# Patient Record
Sex: Female | Born: 1970 | Race: White | Hispanic: No | Marital: Married | Smoking: Never smoker
Health system: Southern US, Community
[De-identification: ages and names within clinical notes are randomized; demographics above are authoritative.]

## PROBLEM LIST (undated history)

## (undated) DIAGNOSIS — K219 Gastro-esophageal reflux disease without esophagitis: Secondary | ICD-10-CM

## (undated) DIAGNOSIS — Z8619 Personal history of other infectious and parasitic diseases: Secondary | ICD-10-CM

## (undated) DIAGNOSIS — T7840XA Allergy, unspecified, initial encounter: Secondary | ICD-10-CM

## (undated) DIAGNOSIS — D239 Other benign neoplasm of skin, unspecified: Secondary | ICD-10-CM

## (undated) DIAGNOSIS — F32A Depression, unspecified: Secondary | ICD-10-CM

## (undated) DIAGNOSIS — D126 Benign neoplasm of colon, unspecified: Secondary | ICD-10-CM

## (undated) DIAGNOSIS — Z87898 Personal history of other specified conditions: Secondary | ICD-10-CM

## (undated) DIAGNOSIS — Z872 Personal history of diseases of the skin and subcutaneous tissue: Secondary | ICD-10-CM

## (undated) DIAGNOSIS — F329 Major depressive disorder, single episode, unspecified: Secondary | ICD-10-CM

## (undated) HISTORY — PX: UPPER GASTROINTESTINAL ENDOSCOPY: SHX188

## (undated) HISTORY — DX: Benign neoplasm of colon, unspecified: D12.6

## (undated) HISTORY — DX: Allergy, unspecified, initial encounter: T78.40XA

## (undated) HISTORY — DX: Personal history of other specified conditions: Z87.898

## (undated) HISTORY — PX: COLONOSCOPY: SHX174

## (undated) HISTORY — DX: Depression, unspecified: F32.A

## (undated) HISTORY — PX: POLYPECTOMY: SHX149

## (undated) HISTORY — DX: Personal history of diseases of the skin and subcutaneous tissue: Z87.2

## (undated) HISTORY — DX: Major depressive disorder, single episode, unspecified: F32.9

## (undated) HISTORY — DX: Other benign neoplasm of skin, unspecified: D23.9

## (undated) HISTORY — DX: Gastro-esophageal reflux disease without esophagitis: K21.9

## (undated) HISTORY — DX: Personal history of other infectious and parasitic diseases: Z86.19

---

## 2008-08-18 LAB — HM MAMMOGRAPHY: HM Mammogram: NORMAL

## 2008-12-18 DIAGNOSIS — D126 Benign neoplasm of colon, unspecified: Secondary | ICD-10-CM

## 2008-12-18 HISTORY — DX: Benign neoplasm of colon, unspecified: D12.6

## 2009-09-18 LAB — HM PAP SMEAR: HM Pap smear: NORMAL

## 2010-05-15 ENCOUNTER — Encounter: Payer: Self-pay | Admitting: Family

## 2010-05-15 ENCOUNTER — Ambulatory Visit (INDEPENDENT_AMBULATORY_CARE_PROVIDER_SITE_OTHER): Payer: BC Managed Care – PPO | Admitting: Family

## 2010-05-15 DIAGNOSIS — R609 Edema, unspecified: Secondary | ICD-10-CM

## 2010-05-15 DIAGNOSIS — R400 Somnolence: Secondary | ICD-10-CM

## 2010-05-15 DIAGNOSIS — M7989 Other specified soft tissue disorders: Secondary | ICD-10-CM

## 2010-05-15 DIAGNOSIS — R413 Other amnesia: Secondary | ICD-10-CM

## 2010-05-15 DIAGNOSIS — R404 Transient alteration of awareness: Secondary | ICD-10-CM

## 2010-05-15 DIAGNOSIS — R229 Localized swelling, mass and lump, unspecified: Secondary | ICD-10-CM

## 2010-05-15 DIAGNOSIS — F32A Depression, unspecified: Secondary | ICD-10-CM

## 2010-05-15 DIAGNOSIS — F3289 Other specified depressive episodes: Secondary | ICD-10-CM

## 2010-05-15 DIAGNOSIS — R5381 Other malaise: Secondary | ICD-10-CM

## 2010-05-15 DIAGNOSIS — F329 Major depressive disorder, single episode, unspecified: Secondary | ICD-10-CM

## 2010-05-15 DIAGNOSIS — R5383 Other fatigue: Secondary | ICD-10-CM

## 2010-05-15 DIAGNOSIS — E559 Vitamin D deficiency, unspecified: Secondary | ICD-10-CM

## 2010-05-15 LAB — CBC WITH DIFFERENTIAL/PLATELET
Basophils Absolute: 0 10*3/uL (ref 0.0–0.1)
Eosinophils Relative: 0 % (ref 0–5)
Lymphocytes Relative: 14 % (ref 12–46)
Lymphs Abs: 1.3 10*3/uL (ref 0.7–4.0)
MCV: 92.9 fL (ref 78.0–100.0)
Neutro Abs: 7.4 10*3/uL (ref 1.7–7.7)
Neutrophils Relative %: 81 % — ABNORMAL HIGH (ref 43–77)
Platelets: 218 10*3/uL (ref 150–400)
RBC: 4.34 MIL/uL (ref 3.87–5.11)
RDW: 12.7 % (ref 11.5–15.5)
WBC: 9.1 10*3/uL (ref 4.0–10.5)

## 2010-05-15 LAB — TSH: TSH: 0.717 u[IU]/mL (ref 0.350–4.500)

## 2010-05-15 LAB — HEPATIC FUNCTION PANEL
ALT: 12 U/L (ref 0–35)
Alkaline Phosphatase: 59 U/L (ref 39–117)
Bilirubin, Direct: 0.2 mg/dL (ref 0.0–0.3)
Indirect Bilirubin: 0.5 mg/dL (ref 0.0–0.9)

## 2010-05-15 LAB — BASIC METABOLIC PANEL
CO2: 25 mEq/L (ref 19–32)
Chloride: 100 mEq/L (ref 96–112)
Creat: 0.73 mg/dL (ref 0.40–1.20)
Sodium: 138 mEq/L (ref 135–145)

## 2010-05-15 MED ORDER — FUROSEMIDE 20 MG PO TABS: 20.0000 mg | ORAL_TABLET | Freq: Every day | ORAL | Status: DC | PRN

## 2010-05-15 NOTE — Progress Notes (Signed)
Subjective:    Patient ID: Marie Ortega, female    DOB: 20-Apr-1970, 40 y.o.   MRN: [id number]  HPI  She moved here 1 month ago from [place].  Works as a Charity fundraiser for [employer].  Leg swelling x 1 year-  Both legs- uncomfortable. No associated pain in legs except for the tightness associated with edema. Denies associated SOB.  No swelling in the AM- worse as the day wears on.    Memory- some problem with word finding- "hand me the mattress, (pillow)". Forgetfulness,  Loses things.   Somnolence- feels herself falling asleep even at work.  Sleeps 7 hours a night- though restless for last 2-3 hours.  Does not snore.    Vitamin d deficiency- she reports that she she takes calcium + D supplement.  Depression- was treated 15 yrs ago.  Brother has bipolar.  Parents have no history of depression.    Review of Systems  Constitutional: Negative for fever and unexpected weight change.  HENT:       Some problems with hearing- use of claritin improved her symptoms  Eyes: Negative for visual disturbance.  Respiratory: Negative for shortness of breath and wheezing.   Cardiovascular: Positive for leg swelling. Negative for chest pain.  Gastrointestinal: Negative for nausea, vomiting and diarrhea.  Genitourinary: Negative for urgency and frequency.  Musculoskeletal:       See HPI  Neurological: Negative for dizziness, weakness and numbness.  Hematological: Negative for adenopathy.  Psychiatric/Behavioral:       See HPI   Chronic knee pain.    Past Medical History  Diagnosis Date  . Depression   . History of chicken pox   . Adenomatous colon polyp     gets colonoscopies every 3 years.      History   Social History  . Marital Status: Married    Spouse Name: N/A    Number of Children: 2  . Years of Education: N/A   Occupational History  . chemist    Social History Main Topics  . Smoking status: Never Smoker   . Smokeless tobacco: Never Used  . Alcohol Use: No  . Drug  Use: No  . Sexually Active: Not on file   Other Topics Concern  . Not on file   Social History Narrative   Caffeine use: noneRegular exercise:  No    No past surgical history on file.  Family History  Problem Relation Age of Onset  . Cancer Mother     colon    No Known Allergies  No current outpatient prescriptions on file prior to visit.    BP 100/50  Pulse 66  Temp(Src) 98.2 F (36.8 C) (Oral)  Resp 16  Ht 5\' 6"  (1.676 m)  Wt 132 lb 0.6 oz (59.893 kg)  BMI 21.31 kg/m2  LMP 04/30/2010    Objective:   Physical Exam  Constitutional: She is oriented to person, place, and time. She appears well-developed and well-nourished.       Thin white female, awake, alert and in NAD  HENT:  Head: Normocephalic and atraumatic.  Eyes: Conjunctivae are normal.  Neck: Neck supple.  Cardiovascular: Normal rate and regular rhythm.   Pulmonary/Chest: Effort normal and breath sounds normal.  Musculoskeletal:       1+ bilateral LE edema- non-pitting.  Neurological: She is alert and oriented to person, place, and time. Coordination normal.  Psychiatric: She has a normal mood and affect. Her behavior is normal. Judgment and thought content normal.  Assessment & Plan:

## 2010-05-15 NOTE — Patient Instructions (Signed)
Please complete your lab work on the first floor.  Follow up in 3 weeks.

## 2010-05-16 LAB — VITAMIN D 25 HYDROXY (VIT D DEFICIENCY, FRACTURES): Vit D, 25-Hydroxy: 43 ng/mL (ref 30–89)

## 2010-05-17 ENCOUNTER — Encounter: Payer: Self-pay | Admitting: Family

## 2010-05-18 ENCOUNTER — Telehealth: Payer: Self-pay | Admitting: *Deleted

## 2010-05-18 NOTE — Telephone Encounter (Signed)
Left message on machine for pt to return my call. Need to verify that pt wants [place] to use that to obtain records from previous doctor.

## 2010-05-18 NOTE — Telephone Encounter (Signed)
Spoke to pt, she would like [place] to use the records release that she brought to [place]. Release faxed.

## 2010-05-19 DIAGNOSIS — E559 Vitamin D deficiency, unspecified: Secondary | ICD-10-CM | POA: Insufficient documentation

## 2010-05-19 DIAGNOSIS — F32A Depression, unspecified: Secondary | ICD-10-CM | POA: Insufficient documentation

## 2010-05-19 DIAGNOSIS — M7989 Other specified soft tissue disorders: Secondary | ICD-10-CM | POA: Insufficient documentation

## 2010-05-19 DIAGNOSIS — R413 Other amnesia: Secondary | ICD-10-CM | POA: Insufficient documentation

## 2010-05-19 DIAGNOSIS — F329 Major depressive disorder, single episode, unspecified: Secondary | ICD-10-CM | POA: Insufficient documentation

## 2010-05-19 DIAGNOSIS — R400 Somnolence: Secondary | ICD-10-CM | POA: Insufficient documentation

## 2010-05-19 NOTE — Assessment & Plan Note (Signed)
Pt reports that this is stable. Largely situational depression approximatey 15 yrs ago.  Will monitor.

## 2010-05-19 NOTE — Assessment & Plan Note (Signed)
TSH is normal.  Per history she has a poor sleep pattern.  Consider addition of sleep aid next visit.

## 2010-05-19 NOTE — Assessment & Plan Note (Signed)
Lab work unremarkable, add prn lasix, if no improvement consider 2-D echo.

## 2010-05-19 NOTE — Assessment & Plan Note (Signed)
Lab work is unremarkable.  Other considerations include stress, fatigue or anxiety.  Will discuss further next visit.

## 2010-05-28 ENCOUNTER — Telehealth: Payer: Self-pay | Admitting: *Deleted

## 2010-05-28 ENCOUNTER — Encounter: Payer: Self-pay | Admitting: Family

## 2010-05-28 NOTE — Telephone Encounter (Signed)
Received records from [facility] 02/12/10 to 03/23/10.

## 2010-06-05 ENCOUNTER — Ambulatory Visit (INDEPENDENT_AMBULATORY_CARE_PROVIDER_SITE_OTHER): Payer: BC Managed Care – PPO | Admitting: Family

## 2010-06-05 ENCOUNTER — Encounter: Payer: Self-pay | Admitting: Family

## 2010-06-05 VITALS — BP 100/78 | HR 72 | Temp 97.8°F | Resp 16 | Ht 66.0 in | Wt 133.0 lb

## 2010-06-05 DIAGNOSIS — F32A Depression, unspecified: Secondary | ICD-10-CM

## 2010-06-05 DIAGNOSIS — R400 Somnolence: Secondary | ICD-10-CM

## 2010-06-05 DIAGNOSIS — M7989 Other specified soft tissue disorders: Secondary | ICD-10-CM

## 2010-06-05 DIAGNOSIS — F341 Dysthymic disorder: Secondary | ICD-10-CM

## 2010-06-05 DIAGNOSIS — R404 Transient alteration of awareness: Secondary | ICD-10-CM

## 2010-06-05 DIAGNOSIS — F419 Anxiety disorder, unspecified: Secondary | ICD-10-CM | POA: Insufficient documentation

## 2010-06-05 DIAGNOSIS — R609 Edema, unspecified: Secondary | ICD-10-CM

## 2010-06-05 LAB — BASIC METABOLIC PANEL
BUN: 14 mg/dL (ref 6–23)
Chloride: 98 mEq/L (ref 96–112)
Creat: 0.77 mg/dL (ref 0.40–1.20)
Glucose, Bld: 91 mg/dL (ref 70–99)
Potassium: 3.9 mEq/L (ref 3.5–5.3)

## 2010-06-05 MED ORDER — CITALOPRAM HYDROBROMIDE 20 MG PO TABS: 20.0000 mg | ORAL_TABLET | Freq: Every day | ORAL | Status: DC

## 2010-06-05 NOTE — Assessment & Plan Note (Signed)
I suspect that her complaints of memory and irritability are due to some associated depression and anxiety. She has tolerated SSRIs in the past remotely. We will give her a trial of citalopram. She was counseled on side effects including increased risk of suicide ideation. She is instructed to call if she develops worsening depression or suicidal ideation on this medicine. She verbalizes understanding. Plan followup in one month.

## 2010-06-05 NOTE — Progress Notes (Signed)
  Subjective:    Patient ID: Marie Ortega, female    DOB: Dec 03, 1970, 39 y.o.   MRN: [id number]  HPI Marie Ortega is a 40 year old female who presents today for followup of her edema and memory issues.  1. Edema-she notes some improvement with the use of daily Lasix. Although she does still have some swelling in the evenings. She denies difficulty laying flat, or shortness of breath with exertion.  2. Memory loss-  she notes that she continues to be forgetful. She reports that she is having a difficult time motivating to get out of bed in the mornings. She also finds it difficult to sit down and play with her children as she becomes easily frustrated. She notes that she falls asleep crying several times a week. She feels "snappy and edgy."  Last week she reports that she had difficulty focusing and felt "discombobulated".  somnolece- goes to bed at 10:30 falls asleep at 11PM.  Often feels herself, "dozing off."   Review of Systems See HPI  Past Medical History  Diagnosis Date  . Depression   . History of chicken pox   . Adenomatous colon polyp     gets colonoscopies every 3 years.      History   Social History  . Marital Status: Married    Spouse Name: N/A    Number of Children: 2  . Years of Education: N/A   Occupational History  . chemist    Social History Main Topics  . Smoking status: Never Smoker   . Smokeless tobacco: Never Used  . Alcohol Use: No  . Drug Use: No  . Sexually Active: Not on file   Other Topics Concern  . Not on file   Social History Narrative   Caffeine use: noneRegular exercise:  No    No past surgical history on file.  Family History  Problem Relation Age of Onset  . Cancer Mother     colon    No Known Allergies  Current Outpatient Prescriptions on File Prior to Visit  Medication Sig Dispense Refill  . Calcium Carbonate-Vitamin D (CALCIUM 600+D) 600-400 MG-UNIT per tablet Take 1 tablet by mouth 2 (two) times daily.       .  furosemide (LASIX) 20 MG tablet Take 1 tablet (20 mg total) by mouth daily as needed.  30 tablet  1  . Multiple Vitamin (MULTIVITAMIN) tablet Take 1 tablet by mouth daily.        Marland Kitchen loratadine (CLARITIN) 10 MG tablet Take 10 mg by mouth daily.          BP 100/78  Pulse 72  Temp(Src) 97.8 F (36.6 C) (Oral)  Resp 16  Ht 5\' 6"  (1.676 m)  Wt 133 lb (60.328 kg)  BMI 21.47 kg/m2  LMP 05/28/2010       Objective:   Physical Exam General: Pleasant white female, awake and alert and in no acute distress Cardiovascular: S1, S2 regular rate and rhythm Respiratory: Breath sounds clear to auscultation bilaterally without wheezes rales or rhonchi. Psychiatric: Moderately anxious appearing, alert and oriented x3. Speech and judgment are within normal limits. Extremities: No significant edema noted today on exam.       Assessment & Plan:  25 minutes spent with the patient today. Greater than 50% of this time was spent counseling the patient on her anxiety and depression.

## 2010-06-05 NOTE — Assessment & Plan Note (Signed)
Improved. Continue Lasix when necessary. Will check BE met today to assess potassium.

## 2010-06-05 NOTE — Assessment & Plan Note (Signed)
I suspect that this is related to her poor sleep pattern. Hopefully with improving her depression and anxiety her sleep pattern will also improve.

## 2010-06-05 NOTE — Patient Instructions (Addendum)
Citalopram- 20mg  tabs- take 1/2 tablet once daily for 1 week, then increase to a full tablet once daily on week two.  Follow up in 1 month.

## 2010-06-08 ENCOUNTER — Encounter: Payer: Self-pay | Admitting: Family

## 2010-06-09 ENCOUNTER — Telehealth: Payer: Self-pay | Admitting: *Deleted

## 2010-06-09 MED ORDER — FLUOXETINE HCL 10 MG PO CAPS: ORAL_CAPSULE | ORAL | Status: DC

## 2010-06-09 NOTE — Telephone Encounter (Signed)
Please let patient know that since she has tolerated fluoxetine (Prozac) in the past, I will switch her to prozac 10mg  daily for 1 week, then increase to 20mg  (2 tabs)on week two as tolerated.  Hopefully her symptoms will be less on fluoxetine, but either way should improve in a few weeks.

## 2010-06-09 NOTE — Telephone Encounter (Signed)
Received message from pt stating she is taking Citalopram 1/2 tablet daily since Friday and has had nausea and difficulty focusing on her current task. Pt wants to know how long these side effects should last. She is concerned about increasing to a whole tablet while experiencing these symptoms. Please advise.

## 2010-06-09 NOTE — Telephone Encounter (Signed)
Left message on machine to return my call. 

## 2010-06-10 NOTE — Telephone Encounter (Signed)
Pt left message stating it was ok to leave detailed message on her voicemail. Attempted to reach pt, left detailed message re: Melissa's instruction and to call if any questions.

## 2010-06-10 NOTE — Telephone Encounter (Signed)
Left message on machine to return my call. 

## 2010-06-12 ENCOUNTER — Telehealth: Payer: Self-pay | Admitting: *Deleted

## 2010-06-12 NOTE — Telephone Encounter (Signed)
Yes ok to wait a few days before starting prozac.

## 2010-06-12 NOTE — Telephone Encounter (Signed)
Pt.notified

## 2010-06-12 NOTE — Telephone Encounter (Signed)
Pt left message stating she is still having slight nausea from Citalopram and wanted to know if it would be ok to wait until her side effects have subsided before she starts the Prozac. Please advise.

## 2010-06-29 ENCOUNTER — Ambulatory Visit (INDEPENDENT_AMBULATORY_CARE_PROVIDER_SITE_OTHER): Payer: BC Managed Care – PPO | Admitting: Family

## 2010-06-29 ENCOUNTER — Encounter: Payer: Self-pay | Admitting: Family

## 2010-06-29 DIAGNOSIS — F341 Dysthymic disorder: Secondary | ICD-10-CM

## 2010-06-29 DIAGNOSIS — F32A Depression, unspecified: Secondary | ICD-10-CM

## 2010-06-29 DIAGNOSIS — F329 Major depressive disorder, single episode, unspecified: Secondary | ICD-10-CM

## 2010-06-29 NOTE — Assessment & Plan Note (Signed)
Denies suicide ideation.  Some improvement since last visit. I encouraged her to continue current dose of fluoxetine and to follow up in 1 month. If no improvement at that time with memory, will perform a MMSE- although I suspect that her memory issues are actually more related to her anxiety.

## 2010-06-29 NOTE — Progress Notes (Signed)
  Subjective:    Patient ID: Marie Ortega, female    DOB: [birth year], 40 y.o.   MRN: [id number]  HPI Marie Ortega is 40 yr old female who presents today for follow up of her depression/anxiety.  Notes that she felt "spacey and disconnected" on citalopram.  Notes that she was also grumpy on citalopram.  Today she feels that she is having to "work hard at things." She started the prozac on Memorial day.  Has only been taking the 20mg  for a little over 1 week. She is sleeping better.  Feels that she is better able to sit down with her kids and handle their bickering etch.  Overall feels some better.  Memory issues are unchanged.  Review of Systems See HPI  Past Medical History  Diagnosis Date  . Depression   . History of chicken pox   . Adenomatous colon polyp     gets colonoscopies every 3 years.      History   Social History  . Marital Status: Married    Spouse Name: N/A    Number of Children: 2  . Years of Education: N/A   Occupational History  . chemist    Social History Main Topics  . Smoking status: Never Smoker   . Smokeless tobacco: Never Used  . Alcohol Use: No  . Drug Use: No  . Sexually Active: Not on file   Other Topics Concern  . Not on file   Social History Narrative   Caffeine use: noneRegular exercise:  No    No past surgical history on file.  Family History  Problem Relation Age of Onset  . Cancer Mother     colon    No Known Allergies  Current Outpatient Prescriptions on File Prior to Visit  Medication Sig Dispense Refill  . BIOTIN FORTE PO Take 1 tablet by mouth daily.        . Calcium Carbonate-Vitamin D (CALCIUM 600+D) 600-400 MG-UNIT per tablet Take 1 tablet by mouth 2 (two) times daily.       Marland Kitchen FLUoxetine (PROZAC) 10 MG capsule One tablet by mouth once daily for 1 week, then increase to 2 tabs once daily on week two.  60 capsule  3  . furosemide (LASIX) 20 MG tablet Take 1 tablet (20 mg total) by mouth daily as needed.  30 tablet  1  .  loratadine (CLARITIN) 10 MG tablet Take 10 mg by mouth daily.        . Misc Natural Products (GLUCOSAMINE CHONDROITIN ADV PO) Take 2 tablets by mouth daily.        . Multiple Vitamin (MULTIVITAMIN) tablet Take 1 tablet by mouth daily.          BP 125/72  Pulse 66  Temp(Src) 97.6 F (36.4 C) (Oral)  Resp 16  Ht 5\' 6"  (1.676 m)  Wt 134 lb (60.782 kg)  BMI 21.63 kg/m2  LMP 05/28/2010       Objective:   Physical Exam  Constitutional: She appears well-developed and well-nourished.  Psychiatric: Her behavior is normal. Thought content normal.       Mildly anxious, seems stressed.           Assessment & Plan:  20 minutes spent with the patient today. Greater than 50% of this time was spent counseling patient on her anxiety and depression.

## 2010-06-29 NOTE — Patient Instructions (Signed)
Follow up in 1 month   

## 2010-07-08 ENCOUNTER — Encounter: Payer: Self-pay | Admitting: Family

## 2010-07-27 ENCOUNTER — Ambulatory Visit: Payer: BC Managed Care – PPO | Admitting: Family

## 2010-08-03 ENCOUNTER — Ambulatory Visit (INDEPENDENT_AMBULATORY_CARE_PROVIDER_SITE_OTHER): Payer: BC Managed Care – PPO | Admitting: Family

## 2010-08-03 ENCOUNTER — Encounter: Payer: Self-pay | Admitting: Family

## 2010-08-03 DIAGNOSIS — F341 Dysthymic disorder: Secondary | ICD-10-CM

## 2010-08-03 DIAGNOSIS — F329 Major depressive disorder, single episode, unspecified: Secondary | ICD-10-CM

## 2010-08-03 DIAGNOSIS — F32A Depression, unspecified: Secondary | ICD-10-CM

## 2010-08-03 NOTE — Patient Instructions (Signed)
Please follow up in 3 months. Sooner if problems or concerns.  

## 2010-08-03 NOTE — Progress Notes (Signed)
  Subjective:    Patient ID: Marie Ortega, female    DOB: 1970-08-11, 40 y.o.   MRN: [id number]  HPI  Marie Ortega is a 40 year old female who presents today for followup of her depression. She continues on fluoxetine. She reports that overall her mood is very much improved. She especially noted improvement in the normal moodiness that she experiences premenstrually. She reports that she is better able to handle her kids. She does note that this past week she has been waking up frequently in the middle of the night which is unusual for her. She later tells me that she is in the process of placing off around in the home. She denies that this is causing her any overt distress. She notes a side effect of yawning, however she notes that this seems to be improving. This past week she has felt more sleepy than usual, however she also notes that she's not been sleeping well. In the weeks prior she denied any unusual somnolence during the day. She reports that she has been taking a 10 mg tablet in the morning and 10 mg tablet in the evening. Review of Systems See history present illness  Past Medical History  Diagnosis Date  . Depression   . History of chicken pox   . Adenomatous colon polyp     gets colonoscopies every 3 years.    . Dysplastic nevi   . History of telogen effluvium   . Vitamin D deficiency     History   Social History  . Marital Status: Married    Spouse Name: N/A    Number of Children: 2  . Years of Education: N/A   Occupational History  . chemist    Social History Main Topics  . Smoking status: Never Smoker   . Smokeless tobacco: Never Used  . Alcohol Use: No  . Drug Use: No  . Sexually Active: Not on file   Other Topics Concern  . Not on file   Social History Narrative   Caffeine use: noneRegular exercise:  No    No past surgical history on file.  Family History  Problem Relation Age of Onset  . Cancer Mother     colon    No Known  Allergies  Current Outpatient Prescriptions on File Prior to Visit  Medication Sig Dispense Refill  . BIOTIN FORTE PO Take 1 tablet by mouth daily.        . Calcium Carbonate-Vitamin D (CALCIUM 600+D) 600-400 MG-UNIT per tablet Take 1 tablet by mouth 2 (two) times daily.       . furosemide (LASIX) 20 MG tablet Take 1 tablet (20 mg total) by mouth daily as needed.  30 tablet  1  . Misc Natural Products (GLUCOSAMINE CHONDROITIN ADV PO) Take 2 tablets by mouth daily.        . Multiple Vitamin (MULTIVITAMIN) tablet Take 1 tablet by mouth daily.          BP 92/70  Pulse 60  Temp(Src) 98.7 F (37.1 C) (Oral)  Resp 16  Ht 5\' 6"  (1.676 m)  Wt 133 lb 1.9 oz (60.383 kg)  BMI 21.49 kg/m2  LMP 07/29/2010       Objective:   Physical Exam Gen.: Thin, white female, awake, alert, and in no acute distress Psychiatric: Patient is awake, alert, and in no acute distress. She is calm and pleasant calm affect is appropriate.   Assessment & Plan:

## 2010-08-03 NOTE — Assessment & Plan Note (Signed)
This is improving. I have advised her to try taking the fluoxetine 2 tabs at bedtime once daily. If her daytime somnolence persists I recommended that she wean back to a 10 mg tablet once daily in the evening. If she continues to feel well we will see her back in 3 months, otherwise she is advised to contact us for earlier appointment. She verbalizes understanding. 20 minutes were spent with the patient today, nearly all of this time was spent counseling the patient on her anxiety and depression.

## 2010-09-17 ENCOUNTER — Other Ambulatory Visit (HOSPITAL_COMMUNITY)
Admission: RE | Admit: 2010-09-17 | Discharge: 2010-09-17 | Disposition: A | Discharge status: Home / Self Care | Payer: BC Managed Care – PPO | Source: Ambulatory Visit | Attending: Obstetrics and Gynecology | Admitting: Obstetrics and Gynecology

## 2010-09-17 DIAGNOSIS — Z01419 Encounter for gynecological examination (general) (routine) without abnormal findings: Secondary | ICD-10-CM | POA: Insufficient documentation

## 2010-09-17 DIAGNOSIS — Z1159 Encounter for screening for other viral diseases: Secondary | ICD-10-CM | POA: Insufficient documentation

## 2010-09-17 LAB — HM PAP SMEAR: HM Pap smear: NORMAL

## 2010-10-19 ENCOUNTER — Telehealth: Payer: Self-pay | Admitting: *Deleted

## 2010-10-19 NOTE — Telephone Encounter (Signed)
Received call from pt stating she has a follow up appt in 2 weeks and wanted to know if she could get her flu shot at that time. Also states she is having knee swelling and wants to know if Melissa could treat that or should she see a specialist? Advised pt it is ok to wait until appt to get flu shot and Melissa could assess her knee at that time unless she wants to be seen earlier. Pt will wait for appt.

## 2010-10-20 ENCOUNTER — Other Ambulatory Visit: Payer: Self-pay | Admitting: Family

## 2010-11-02 ENCOUNTER — Encounter: Payer: Self-pay | Admitting: Family

## 2010-11-02 ENCOUNTER — Ambulatory Visit (INDEPENDENT_AMBULATORY_CARE_PROVIDER_SITE_OTHER): Payer: BC Managed Care – PPO | Admitting: Family

## 2010-11-02 VITALS — BP 96/64 | HR 66 | Temp 98.1°F | Resp 16 | Ht 66.0 in | Wt 138.0 lb

## 2010-11-02 DIAGNOSIS — Z23 Encounter for immunization: Secondary | ICD-10-CM

## 2010-11-02 DIAGNOSIS — F419 Anxiety disorder, unspecified: Secondary | ICD-10-CM

## 2010-11-02 DIAGNOSIS — D239 Other benign neoplasm of skin, unspecified: Secondary | ICD-10-CM

## 2010-11-02 DIAGNOSIS — J329 Chronic sinusitis, unspecified: Secondary | ICD-10-CM

## 2010-11-02 DIAGNOSIS — F329 Major depressive disorder, single episode, unspecified: Secondary | ICD-10-CM

## 2010-11-02 DIAGNOSIS — F341 Dysthymic disorder: Secondary | ICD-10-CM

## 2010-11-02 DIAGNOSIS — F32A Depression, unspecified: Secondary | ICD-10-CM

## 2010-11-02 MED ORDER — AMOXICILLIN-POT CLAVULANATE 875-125 MG PO TABS: 1.0000 | ORAL_TABLET | Freq: Two times a day (BID) | ORAL | Status: AC

## 2010-11-02 MED ORDER — FLUOXETINE HCL 10 MG PO CAPS: 20.0000 mg | ORAL_CAPSULE | Freq: Every day | ORAL | Status: DC

## 2010-11-02 NOTE — Progress Notes (Signed)
Subjective:    Patient ID: Marie Ortega, female    DOB: 05/09/1970, 41 y.o.   MRN: [id number]  HPI  Ms.  Trigo is a 40 yr old female who presents today for follow up of her anxiety and depression.  1. Anxiety/Depression: She is currently taking fluoxetine 20mg  once daily.  She is less tired during the day but still tired in the evenings. "I fall asleep with the kids."  Notes now sleeping well through the night.  She is able to get up and get her day started.  Denies palpitations.  Patience is improved.    2. Facial pressure- She also reports + facial pressure, sore throat, thick dark brown nasal discharge.  + ear pain,  Taking claritin.  Little improvement.    3. Hx of dysplastic Nevi- she had a dermatologist in [place] who was performing routine skin checks. She is requesting referral to a local dermatologist. Review of Systems See HPI  Past Medical History  Diagnosis Date  . Depression   . History of chicken pox   . Adenomatous colon polyp     gets colonoscopies every 3 years.    . Dysplastic nevi   . History of telogen effluvium   . Vitamin D deficiency     History   Social History  . Marital Status: Married    Spouse Name: N/A    Number of Children: 2  . Years of Education: N/A   Occupational History  . chemist    Social History Main Topics  . Smoking status: Never Smoker   . Smokeless tobacco: Never Used  . Alcohol Use: No  . Drug Use: No  . Sexually Active: Not on file   Other Topics Concern  . Not on file   Social History Narrative   Caffeine use: noneRegular exercise:  No    No past surgical history on file.  Family History  Problem Relation Age of Onset  . Cancer Mother     colon    No Known Allergies  Current Outpatient Prescriptions on File Prior to Visit  Medication Sig Dispense Refill  . BIOTIN FORTE PO Take 1 tablet by mouth daily.        . Calcium Carbonate-Vitamin D (CALCIUM 600+D) 600-400 MG-UNIT per tablet Take 1 tablet by  mouth 2 (two) times daily.       . furosemide (LASIX) 20 MG tablet Take 1 tablet (20 mg total) by mouth daily as needed.  30 tablet  1  . Misc Natural Products (GLUCOSAMINE CHONDROITIN ADV PO) Take 2 tablets by mouth daily.        . Multiple Vitamin (MULTIVITAMIN) tablet Take 1 tablet by mouth daily.          BP 96/64  Pulse 66  Temp(Src) 98.1 F (36.7 C) (Oral)  Resp 16  Ht 5\' 6"  (1.676 m)  Wt 138 lb 0.6 oz (62.615 kg)  BMI 22.28 kg/m2  LMP 10/30/2010       Objective:   Physical Exam  Constitutional: She appears well-developed and well-nourished.  HENT:  Head: Normocephalic and atraumatic.  Right Ear: Tympanic membrane and ear canal normal.  Left Ear: Tympanic membrane and ear canal normal.  Mouth/Throat: Uvula is midline, oropharynx is clear and moist and mucous membranes are normal.  Cardiovascular: Normal rate and regular rhythm.   No murmur heard. Pulmonary/Chest: Effort normal and breath sounds normal. No respiratory distress. She has no wheezes. She has no rales. She exhibits no tenderness.  Skin:  Skin is warm and dry.  Psychiatric: She has a normal mood and affect. Her behavior is normal. Judgment and thought content normal.          Assessment & Plan:

## 2010-11-03 DIAGNOSIS — D239 Other benign neoplasm of skin, unspecified: Secondary | ICD-10-CM | POA: Insufficient documentation

## 2010-11-03 DIAGNOSIS — J329 Chronic sinusitis, unspecified: Secondary | ICD-10-CM | POA: Insufficient documentation

## 2010-11-03 NOTE — Assessment & Plan Note (Signed)
Will refer to dermatology for ongoing surveillance.

## 2010-11-03 NOTE — Assessment & Plan Note (Signed)
Symptoms are consistent with sinusitis.  Will plan to treat with augmentin.

## 2010-11-03 NOTE — Assessment & Plan Note (Signed)
Improved. She has gained 5 pounds but tells me that she has not been eating healthy.  I have asked her to try to eat healthy and weigh herself regularly. She is to contact us if she continues to gain weight as this can be a side effect of the fluoxetine.

## 2010-12-16 ENCOUNTER — Telehealth: Payer: Self-pay | Admitting: *Deleted

## 2010-12-16 MED ORDER — DULOXETINE HCL 60 MG PO CPEP: 60.0000 mg | ORAL_CAPSULE | Freq: Every day | ORAL | Status: DC

## 2010-12-16 NOTE — Telephone Encounter (Signed)
Pt notified and transferred to front desk to schedule appt.

## 2010-12-16 NOTE — Telephone Encounter (Signed)
Received message from pt stating she is continuing to gain weight. Her "loose" clothes are now tight. Pt wants to change Prozac to something that will not cause her to gain weight. Please advise.

## 2010-12-16 NOTE — Telephone Encounter (Signed)
I would recommend that she stop prozac and start cymbalta.  Follow up in 6 weeks. Rx has been sent to her pharmacy.

## 2010-12-18 NOTE — Telephone Encounter (Signed)
Received message from pt that she picked up rx for Cymbalta and cost was $40. Pt states she does not want to continue paying that amount and wants to know if there is something cheaper we can substitute? Please advise.

## 2010-12-18 NOTE — Telephone Encounter (Signed)
Left message on voice mail request call back.  When pt calls back please let her know that another alternative would be effexor- but I am not sure what the cost would be for her.  This is a different class of medication than the fluoxetine or the cymbalta.  If she is not going to start cymbalta let me know and I will send effexor instead.

## 2010-12-18 NOTE — Telephone Encounter (Signed)
Left detailed message and to call Monday and let us know what she wants to do about Rx change.

## 2010-12-21 MED ORDER — VENLAFAXINE HCL ER 37.5 MG PO CP24: 37.5000 mg | ORAL_CAPSULE | Freq: Every day | ORAL | Status: DC

## 2010-12-21 NOTE — Telephone Encounter (Signed)
Pt.notified

## 2010-12-21 NOTE — Telephone Encounter (Signed)
RX has been sent to pharmacy for effexor. She should follow up in 1 month please.

## 2010-12-21 NOTE — Telephone Encounter (Signed)
Addended by: Sandford Craze on: 12/21/2010 03:59 PM   Modules accepted: Orders

## 2010-12-21 NOTE — Telephone Encounter (Signed)
Pt left message stating the pharmacy was able to take Cymbalta back and verified that generic effexor was covered by her ins at a $10 copay. Pt requests that we send Rx to her pharmacy.

## 2011-01-27 ENCOUNTER — Ambulatory Visit (INDEPENDENT_AMBULATORY_CARE_PROVIDER_SITE_OTHER): Payer: BC Managed Care – PPO | Admitting: Family

## 2011-01-27 ENCOUNTER — Encounter: Payer: Self-pay | Admitting: Family

## 2011-01-27 VITALS — BP 106/80 | HR 72 | Temp 98.2°F | Resp 16 | Wt 141.0 lb

## 2011-01-27 DIAGNOSIS — F341 Dysthymic disorder: Secondary | ICD-10-CM

## 2011-01-27 DIAGNOSIS — F329 Major depressive disorder, single episode, unspecified: Secondary | ICD-10-CM

## 2011-01-27 DIAGNOSIS — F32A Depression, unspecified: Secondary | ICD-10-CM

## 2011-01-27 MED ORDER — VENLAFAXINE HCL ER 75 MG PO CP24: 75.0000 mg | ORAL_CAPSULE | Freq: Every day | ORAL | Status: DC

## 2011-01-27 NOTE — Progress Notes (Signed)
Subjective:    Patient ID: Marie Ortega, female    DOB: 1970-03-03, 41 y.o.   MRN: [id number]  HPI  Ms.  Freeze is a 41 yr old female who presents today for follow up of her anxiety/depression. Last visit she was switched from fluoxetine to cymbalta due to weight gain.  She found the cymbalta too expensive and instead she was placed on effexor trial. She reports that she feels better on effexor than she did prior to treatment with fluoxetine, though she is not feeling quite as good as she did on the fluoxetine. She reports that she has "lots of dreams."  Sleeping pretty well. Feels like she has been a little snappy with her kids.  Weight gain has leveled off.    Review of Systems See HPI  Past Medical History  Diagnosis Date  . Depression   . History of chicken pox   . Adenomatous colon polyp     gets colonoscopies every 3 years.    . Dysplastic nevi   . History of telogen effluvium   . Vitamin D deficiency     History   Social History  . Marital Status: Married    Spouse Name: N/A    Number of Children: 2  . Years of Education: N/A   Occupational History  . chemist    Social History Main Topics  . Smoking status: Never Smoker   . Smokeless tobacco: Never Used  . Alcohol Use: No  . Drug Use: No  . Sexually Active: Not on file   Other Topics Concern  . Not on file   Social History Narrative   Caffeine use: noneRegular exercise:  No    No past surgical history on file.  Family History  Problem Relation Age of Onset  . Cancer Mother     colon    No Known Allergies  Current Outpatient Prescriptions on File Prior to Visit  Medication Sig Dispense Refill  . BIOTIN FORTE PO Take 1 tablet by mouth daily.        . Calcium Carbonate-Vitamin D (CALCIUM 600+D) 600-400 MG-UNIT per tablet Take 1 tablet by mouth 2 (two) times daily.       . furosemide (LASIX) 20 MG tablet Take 1 tablet (20 mg total) by mouth daily as needed.  30 tablet  1  . Misc Natural  Products (GLUCOSAMINE CHONDROITIN ADV PO) Take 2 tablets by mouth daily.        . Multiple Vitamin (MULTIVITAMIN) tablet Take 1 tablet by mouth daily.          BP 106/80  Pulse 72  Temp(Src) 98.2 F (36.8 C) (Oral)  Resp 16  Wt 141 lb (63.957 kg)  SpO2 100%  LMP 12/31/2010       Objective:   Physical Exam  Constitutional: She is oriented to person, place, and time. She appears well-developed and well-nourished.  HENT:  Head: Normocephalic and atraumatic.  Cardiovascular:  No murmur heard. Pulmonary/Chest: No respiratory distress. She has no wheezes. She has no rales. She exhibits no tenderness.  Musculoskeletal: She exhibits no edema.  Neurological: She is alert and oriented to person, place, and time.  Skin: Skin is warm and dry.  Psychiatric: She has a normal mood and affect. Her behavior is normal. Judgment and thought content normal.          Assessment & Plan:  15 minutes spent with pt.  >50% percent of this time was spent counseling her on her anxiety and  depression.

## 2011-01-27 NOTE — Assessment & Plan Note (Addendum)
Pt does not feel as good now as she did when she was on fluoxetine. She does feel however that her weight gain has leveled off since starting the effexor.  Cymbalta was too expensive.  Will increase Effexor xr to 75mg  once daily.

## 2011-01-27 NOTE — Patient Instructions (Signed)
Follow-up in 6 weeks

## 2011-03-05 ENCOUNTER — Encounter: Payer: BC Managed Care – PPO | Admitting: Family

## 2011-03-08 ENCOUNTER — Encounter: Payer: Self-pay | Admitting: Family

## 2011-03-08 ENCOUNTER — Ambulatory Visit (INDEPENDENT_AMBULATORY_CARE_PROVIDER_SITE_OTHER): Payer: BC Managed Care – PPO | Admitting: Family

## 2011-03-08 DIAGNOSIS — F419 Anxiety disorder, unspecified: Secondary | ICD-10-CM

## 2011-03-08 DIAGNOSIS — F32A Depression, unspecified: Secondary | ICD-10-CM

## 2011-03-08 DIAGNOSIS — F341 Dysthymic disorder: Secondary | ICD-10-CM

## 2011-03-08 DIAGNOSIS — F329 Major depressive disorder, single episode, unspecified: Secondary | ICD-10-CM

## 2011-03-08 DIAGNOSIS — E559 Vitamin D deficiency, unspecified: Secondary | ICD-10-CM

## 2011-03-08 DIAGNOSIS — Z0000 Encounter for general adult medical examination without abnormal findings: Secondary | ICD-10-CM

## 2011-03-08 LAB — CBC WITH DIFFERENTIAL/PLATELET
HCT: 42.6 % (ref 36.0–46.0)
Hemoglobin: 14.1 g/dL (ref 12.0–15.0)
Lymphs Abs: 1.3 10*3/uL (ref 0.7–4.0)
Monocytes Relative: 9 % (ref 3–12)
Neutro Abs: 3.3 10*3/uL (ref 1.7–7.7)
Neutrophils Relative %: 65 % (ref 43–77)
RBC: 4.57 MIL/uL (ref 3.87–5.11)

## 2011-03-08 LAB — BASIC METABOLIC PANEL WITH GFR
CO2: 29 mEq/L (ref 19–32)
Chloride: 99 mEq/L (ref 96–112)
Glucose, Bld: 86 mg/dL (ref 70–99)
Potassium: 4.6 mEq/L (ref 3.5–5.3)
Sodium: 138 mEq/L (ref 135–145)

## 2011-03-08 LAB — LIPID PANEL
Cholesterol: 178 mg/dL (ref 0–200)
LDL Cholesterol: 100 mg/dL — ABNORMAL HIGH (ref 0–99)
Triglycerides: 91 mg/dL (ref <150)

## 2011-03-08 LAB — HEPATIC FUNCTION PANEL
Alkaline Phosphatase: 71 U/L (ref 39–117)
Indirect Bilirubin: 0.4 mg/dL (ref 0.0–0.9)
Total Protein: 7.8 g/dL (ref 6.0–8.3)

## 2011-03-08 MED ORDER — VENLAFAXINE HCL ER 75 MG PO CP24: 75.0000 mg | ORAL_CAPSULE | Freq: Every day | ORAL | Status: DC

## 2011-03-08 NOTE — Assessment & Plan Note (Signed)
Stable on Effexor, continue same.  

## 2011-03-08 NOTE — Patient Instructions (Signed)
Please complete your blood work prior to leaving. Call us in August to arrange colo referral. Follow up with GYN for your Pap. Follow up with [place] in 6 months- sooner if problems or concerns.

## 2011-03-08 NOTE — Progress Notes (Signed)
Subjective:    Patient ID: Marie Ortega, female    DOB: 13-Jun-1970, 41 y.o.   MRN: [id number]  HPI  Ms.  Marie Ortega is a 41 yr old female who presents today for annual CPX.  She reports that she has been trying to walk more and also has been doing some xbox zumba.  Last pap 8/12- was performed by GYN.  She had colo in 2010 and reportedly had several polyps removed.  Due for follow up colo 10/2011.   Mammogram 8/10- normal, denies family hx of early breast cancer.  Diet is "fine." She has been using a juicer. She is feeling better.  Anxiety-  Notes that she is less "frazzeled".  Feels good on current dose of effexor..    Review of Systems  Constitutional: Negative for unexpected weight change.  HENT: Negative for hearing loss and congestion.   Eyes: Negative for visual disturbance.  Respiratory: Negative for cough.   Cardiovascular: Negative for leg swelling.  Gastrointestinal: Positive for constipation. Negative for nausea, vomiting and diarrhea.  Genitourinary: Negative for dysuria and frequency.  Musculoskeletal: Negative for myalgias and arthralgias.  Skin: Negative for rash.  Neurological:       Occasional stress headaches  Hematological: Negative for adenopathy.  Psychiatric/Behavioral:       Denies depression   Past Medical History  Diagnosis Date  . Depression   . History of chicken pox   . Adenomatous colon polyp     gets colonoscopies every 3 years.    . Dysplastic nevi   . History of telogen effluvium   . Vitamin d deficiency     History   Social History  . Marital Status: Married    Spouse Name: N/A    Number of Children: 2  . Years of Education: N/A   Occupational History  . chemist    Social History Main Topics  . Smoking status: Never Smoker   . Smokeless tobacco: Never Used  . Alcohol Use: No  . Drug Use: No  . Sexually Active: Not on file   Other Topics Concern  . Not on file   Social History Narrative   Caffeine use: noneRegular  exercise:  No    History reviewed. No pertinent past surgical history.  Family History  Problem Relation Age of Onset  . Cancer Mother     colon    No Known Allergies  Current Outpatient Prescriptions on File Prior to Visit  Medication Sig Dispense Refill  . BIOTIN FORTE PO Take 1 tablet by mouth daily.        . Calcium Carbonate-Vitamin D (CALCIUM 600+D) 600-400 MG-UNIT per tablet Take 1 tablet by mouth 2 (two) times daily.       . furosemide (LASIX) 20 MG tablet Take 1 tablet (20 mg total) by mouth daily as needed.  30 tablet  1  . Misc Natural Products (GLUCOSAMINE CHONDROITIN ADV PO) Take 2 tablets by mouth daily.        . Multiple Vitamin (MULTIVITAMIN) tablet Take 1 tablet by mouth daily.          BP 102/70  Pulse 67  Temp(Src) 97.6 F (36.4 C) (Oral)  Resp 16  Ht 5\' 5"  (1.651 m)  Wt 137 lb 1.9 oz (62.197 kg)  BMI 22.82 kg/m2  SpO2 99%  LMP 03/03/2011        Objective:   Physical Exam  Physical Exam  Constitutional: She is oriented to person, place, and time. She appears well-developed and  well-nourished. No distress.  HENT:  Head: Normocephalic and atraumatic.  Right Ear: Tympanic membrane and ear canal normal.  Left Ear: Tympanic membrane and ear canal normal.  Mouth/Throat: Oropharynx is clear and moist.  Eyes: Pupils are equal, round, and reactive to light. No scleral icterus.  Neck: Normal range of motion. No thyromegaly present.  Cardiovascular: Normal rate and regular rhythm.   No murmur heard. Pulmonary/Chest: Effort normal and breath sounds normal. No respiratory distress. He has no wheezes. She has no rales. She exhibits no tenderness.  Abdominal: Soft. Bowel sounds are normal. He exhibits no distension and no mass. There is no tenderness. There is no rebound and no guarding.  Musculoskeletal: She exhibits no edema.  Lymphadenopathy:    She has no cervical adenopathy.  Neurological: She is alert and oriented to person, place, and time. She has  normal reflexes. She exhibits normal muscle tone. Coordination normal.  Skin: Skin is warm and dry.  Psychiatric: She has a normal mood and affect. Her behavior is normal. Judgment and thought content normal.  Breasts: Examined lying Right: Without masses, retractions, discharge or axillary adenopathy.  Left: Without masses, retractions, discharge or axillary adenopathy.  Pelvic- deferred to GYN Assessment & Plan:

## 2011-03-08 NOTE — Assessment & Plan Note (Signed)
41 yr old female here today for CPX.  Obtain fasting laboratories.  She will be due for follow up colo this fall and will contact our office at that time for referral.  No need to repeat mammo until age 38.  Immunizations reviewed and up to date.

## 2011-03-09 ENCOUNTER — Encounter: Payer: Self-pay | Admitting: Family

## 2011-04-28 ENCOUNTER — Telehealth: Payer: Self-pay | Admitting: Family

## 2011-04-28 MED ORDER — VENLAFAXINE HCL ER 75 MG PO CP24: 75.0000 mg | ORAL_CAPSULE | Freq: Every day | ORAL | Status: DC

## 2011-04-28 NOTE — Telephone Encounter (Signed)
Refill sent to pharmacy #[id number] x 4 refills.

## 2011-04-28 NOTE — Telephone Encounter (Signed)
Venlafaxine hcl er 75mg  cap

## 2011-05-03 ENCOUNTER — Encounter: Payer: BC Managed Care – PPO | Admitting: Family

## 2011-05-31 ENCOUNTER — Encounter: Payer: Self-pay | Admitting: Family

## 2011-05-31 ENCOUNTER — Ambulatory Visit (INDEPENDENT_AMBULATORY_CARE_PROVIDER_SITE_OTHER): Payer: BC Managed Care – PPO | Admitting: Family

## 2011-05-31 VITALS — BP 97/60 | HR 75 | Temp 98.4°F | Resp 16 | Wt 139.0 lb

## 2011-05-31 DIAGNOSIS — R609 Edema, unspecified: Secondary | ICD-10-CM

## 2011-05-31 DIAGNOSIS — L259 Unspecified contact dermatitis, unspecified cause: Secondary | ICD-10-CM

## 2011-05-31 DIAGNOSIS — A084 Viral intestinal infection, unspecified: Secondary | ICD-10-CM

## 2011-05-31 DIAGNOSIS — A09 Infectious gastroenteritis and colitis, unspecified: Secondary | ICD-10-CM

## 2011-05-31 DIAGNOSIS — L309 Dermatitis, unspecified: Secondary | ICD-10-CM

## 2011-05-31 MED ORDER — FUROSEMIDE 20 MG PO TABS: 20.0000 mg | ORAL_TABLET | Freq: Every day | ORAL | Status: DC | PRN

## 2011-05-31 MED ORDER — CLOTRIMAZOLE-BETAMETHASONE 1-0.05 % EX CREA: TOPICAL_CREAM | Freq: Two times a day (BID) | CUTANEOUS | Status: DC

## 2011-05-31 NOTE — Progress Notes (Signed)
  Subjective:    Patient ID: Marie Ortega, female    DOB: 10-17-1970, 41 y.o.   MRN: [id number]  HPI  Ms.  Jahr is a 41 yr old female who presents today with several concerns  1) Pruritis- both breasts.  Notes that it is in the nipple area- minimal help with hydrocortisone.    2) Diarrhea- symptoms started 3/10.  Started with cramping after she ate pizza for lunch.  Saturday had 5 bms.  Yesterday 5,  2 today early this AM.  No vomitting.  Tried pepto bismol.    3) LE edema- Reports that she had not needed lasix for months.  Then in April she noted LE edema.  She continues lasix- prn.  She has not taken any lasix today. No shortness of breath or chest [ao/    Review of Systems See HPI  Past Medical History  Diagnosis Date  . Depression   . History of chicken pox   . Adenomatous colon polyp     gets colonoscopies every 3 years.    . Dysplastic nevi   . History of telogen effluvium   . Vitamin d deficiency     History   Social History  . Marital Status: Married    Spouse Name: N/A    Number of Children: 2  . Years of Education: N/A   Occupational History  . chemist    Social History Main Topics  . Smoking status: Never Smoker   . Smokeless tobacco: Never Used  . Alcohol Use: No  . Drug Use: No  . Sexually Active: Not on file   Other Topics Concern  . Not on file   Social History Narrative   Caffeine use: noneRegular exercise:  No    No past surgical history on file.  Family History  Problem Relation Age of Onset  . Cancer Mother     colon    No Known Allergies  Current Outpatient Prescriptions on File Prior to Visit  Medication Sig Dispense Refill  . BIOTIN FORTE PO Take 1 tablet by mouth daily.        . Calcium Carbonate-Vitamin D (CALCIUM 600+D) 600-400 MG-UNIT per tablet Take 1 tablet by mouth 2 (two) times daily.       . Misc Natural Products (GLUCOSAMINE CHONDROITIN ADV PO) Take 2 tablets by mouth daily.        . Multiple Vitamin  (MULTIVITAMIN) tablet Take 1 tablet by mouth daily.        Marland Kitchen venlafaxine (EFFEXOR-XR) 75 MG 24 hr capsule Take 1 capsule (75 mg total) by mouth daily.  30 capsule  4  . furosemide (LASIX) 20 MG tablet Take 1 tablet (20 mg total) by mouth daily as needed.  30 tablet  1    Wt 139 lb (63.05 kg)       Objective:   Physical Exam  Constitutional: She appears well-developed and well-nourished. No distress.  Cardiovascular: Normal rate and regular rhythm.   No murmur heard. Pulmonary/Chest: Effort normal and breath sounds normal. No respiratory distress. She has no wheezes. She has no rales. She exhibits no tenderness.  Musculoskeletal:       No lower extremity edema.   Skin: Skin is warm and dry.       No rash noted on nipples/areola.   Psychiatric: She has a normal mood and affect. Her behavior is normal. Judgment and thought content normal.          Assessment & Plan:

## 2011-05-31 NOTE — Patient Instructions (Signed)
Please call if symptoms worsen or if no improvement with support stockings.  Follow up in 3 months.

## 2011-05-31 NOTE — Assessment & Plan Note (Signed)
Diarrhea seems to be tapering off. Recommended that she use lomotil prn and call us if symptoms do not continue to improve.

## 2011-05-31 NOTE — Assessment & Plan Note (Signed)
Mild.  Continue lasix prn.  No cardiac or pulmonary symptoms to suggest underlying cardiac etiology for swelling.  She stands a lot at her job.  Recommended that she wear support knee highs at work such as Engineer, civil (consulting) mates.

## 2011-05-31 NOTE — Assessment & Plan Note (Signed)
Suspect mild eczema of nipples/areola- no significant improvement with cortisone.  Recommended trial of lotrisone to help cover any fungal component.

## 2011-09-06 ENCOUNTER — Ambulatory Visit (INDEPENDENT_AMBULATORY_CARE_PROVIDER_SITE_OTHER): Payer: BC Managed Care – PPO | Admitting: Family

## 2011-09-06 ENCOUNTER — Encounter: Payer: Self-pay | Admitting: Family

## 2011-09-06 VITALS — BP 111/64 | HR 71 | Temp 98.6°F | Resp 14 | Wt 139.8 lb

## 2011-09-06 DIAGNOSIS — F341 Dysthymic disorder: Secondary | ICD-10-CM

## 2011-09-06 DIAGNOSIS — F329 Major depressive disorder, single episode, unspecified: Secondary | ICD-10-CM

## 2011-09-06 DIAGNOSIS — M25551 Pain in right hip: Secondary | ICD-10-CM

## 2011-09-06 DIAGNOSIS — M25559 Pain in unspecified hip: Secondary | ICD-10-CM

## 2011-09-06 DIAGNOSIS — F32A Depression, unspecified: Secondary | ICD-10-CM

## 2011-09-06 DIAGNOSIS — F419 Anxiety disorder, unspecified: Secondary | ICD-10-CM

## 2011-09-06 MED ORDER — MELOXICAM 7.5 MG PO TABS: 7.5000 mg | ORAL_TABLET | Freq: Every day | ORAL | Status: DC

## 2011-09-06 MED ORDER — VENLAFAXINE HCL ER 150 MG PO CP24: 150.0000 mg | ORAL_CAPSULE | Freq: Every day | ORAL | Status: DC

## 2011-09-06 NOTE — Progress Notes (Signed)
  Subjective:    Patient ID: Marie Ortega, female    DOB: 11-06-1970, 41 y.o.   MRN: [id number]  HPI  Marie Ortega is a 41 yr old female who presents today for follow up.  R hip pain- started 1 month ago.  She has been taking ibuprofen.  Shoulders hurting.  Anxiety- still having bad pms symptoms. More irritability.  Not as bad as prior to her period.     Review of Systems See HPI  Past Medical History  Diagnosis Date  . Depression   . History of chicken pox   . Adenomatous colon polyp     gets colonoscopies every 3 years.    . Dysplastic nevi   . History of telogen effluvium   . Vitamin d deficiency     History   Social History  . Marital Status: Married    Spouse Name: N/A    Number of Children: 2  . Years of Education: N/A   Occupational History  . chemist    Social History Main Topics  . Smoking status: Never Smoker   . Smokeless tobacco: Never Used  . Alcohol Use: No  . Drug Use: No  . Sexually Active: Not on file   Other Topics Concern  . Not on file   Social History Narrative   Caffeine use: noneRegular exercise:  No    No past surgical history on file.  Family History  Problem Relation Age of Onset  . Cancer Mother     colon    No Known Allergies  Current Outpatient Prescriptions on File Prior to Visit  Medication Sig Dispense Refill  . BIOTIN FORTE PO Take 1 tablet by mouth daily.        . Calcium Carbonate-Vitamin D (CALCIUM 600+D) 600-400 MG-UNIT per tablet Take 1 tablet by mouth 2 (two) times daily.       . clotrimazole-betamethasone (LOTRISONE) cream Apply topically 2 (two) times daily.  30 g  0  . furosemide (LASIX) 20 MG tablet Take 1 tablet (20 mg total) by mouth daily as needed.  30 tablet  5  . Misc Natural Products (GLUCOSAMINE CHONDROITIN ADV PO) Take 2 tablets by mouth daily.        . Multiple Vitamin (MULTIVITAMIN) tablet Take 1 tablet by mouth daily.        Marland Kitchen venlafaxine XR (EFFEXOR-XR) 150 MG 24 hr capsule Take 1 capsule  (150 mg total) by mouth daily.  30 capsule  1    BP 111/64  Pulse 71  Temp 98.6 F (37 C) (Oral)  Resp 14  Wt 139 lb 12 oz (63.39 kg)  SpO2 99%  LMP 09/06/2011       Objective:   Physical Exam  Constitutional: She appears well-developed and well-nourished. No distress.  Cardiovascular: Normal rate and regular rhythm.   No murmur heard. Pulmonary/Chest: Effort normal and breath sounds normal. No respiratory distress. She has no wheezes. She has no rales. She exhibits no tenderness.  Musculoskeletal:       + pain R hip with abduction and adduction  Psychiatric: She has a normal mood and affect. Her behavior is normal. Judgment and thought content normal.          Assessment & Plan:

## 2011-09-06 NOTE — Patient Instructions (Addendum)
Please follow up in 6 weeks.

## 2011-09-08 DIAGNOSIS — M25551 Pain in right hip: Secondary | ICD-10-CM | POA: Insufficient documentation

## 2011-09-08 NOTE — Assessment & Plan Note (Signed)
Suspect bursitis.  Trial of meloxicam.  Consider further imaging/ortho referral if symptoms worsen or do not improve.

## 2011-09-08 NOTE — Assessment & Plan Note (Signed)
Deteriorated.  Will increase effexor dose.

## 2011-10-05 ENCOUNTER — Ambulatory Visit: Payer: BC Managed Care – PPO | Admitting: Internal Medicine

## 2011-10-18 ENCOUNTER — Encounter: Payer: Self-pay | Admitting: Gastroenterology

## 2011-10-18 ENCOUNTER — Encounter: Payer: Self-pay | Admitting: Family

## 2011-10-18 ENCOUNTER — Ambulatory Visit (INDEPENDENT_AMBULATORY_CARE_PROVIDER_SITE_OTHER): Payer: BC Managed Care – PPO | Admitting: Family

## 2011-10-18 VITALS — BP 100/50 | HR 87 | Temp 98.5°F | Resp 16 | Ht 65.0 in | Wt 138.0 lb

## 2011-10-18 DIAGNOSIS — D126 Benign neoplasm of colon, unspecified: Secondary | ICD-10-CM

## 2011-10-18 DIAGNOSIS — F341 Dysthymic disorder: Secondary | ICD-10-CM

## 2011-10-18 DIAGNOSIS — F32A Depression, unspecified: Secondary | ICD-10-CM

## 2011-10-18 DIAGNOSIS — M25559 Pain in unspecified hip: Secondary | ICD-10-CM

## 2011-10-18 DIAGNOSIS — Z86010 Personal history of colonic polyps: Secondary | ICD-10-CM

## 2011-10-18 DIAGNOSIS — R404 Transient alteration of awareness: Secondary | ICD-10-CM

## 2011-10-18 DIAGNOSIS — F329 Major depressive disorder, single episode, unspecified: Secondary | ICD-10-CM

## 2011-10-18 DIAGNOSIS — Z23 Encounter for immunization: Secondary | ICD-10-CM

## 2011-10-18 DIAGNOSIS — K635 Polyp of colon: Secondary | ICD-10-CM

## 2011-10-18 DIAGNOSIS — R400 Somnolence: Secondary | ICD-10-CM

## 2011-10-18 DIAGNOSIS — M25551 Pain in right hip: Secondary | ICD-10-CM

## 2011-10-18 MED ORDER — ZOLPIDEM TARTRATE 5 MG PO TABS: 5.0000 mg | ORAL_TABLET | Freq: Every evening | ORAL | Status: DC | PRN

## 2011-10-18 MED ORDER — VENLAFAXINE HCL ER 150 MG PO CP24: 150.0000 mg | ORAL_CAPSULE | Freq: Every day | ORAL | Status: DC

## 2011-10-18 NOTE — Assessment & Plan Note (Signed)
Unchanged. Will give trial of ambien to be used sparingly.

## 2011-10-18 NOTE — Assessment & Plan Note (Addendum)
She is due for a 3 year follow up colonoscopy. Will arrange.

## 2011-10-18 NOTE — Assessment & Plan Note (Signed)
Improved.  Continue current dose of effexor.

## 2011-10-18 NOTE — Progress Notes (Signed)
Subjective:    Patient ID: Marie Ortega, female    DOB: [birth year], 41 y.o.   MRN: [id number]  HPI  Ms.  Sledz is a 41 yr old female who presents today for follow up of her anxiety.  Last visit her effexor dose was increased.  She reports,  "I finally feel like myself."  Feels calmer.    Somnolence- + sleepiness.  + yawning.  Wakes up at 3am every morning. She then tosses and turns until it is time to get up. Feels like she could nod off during the day if she sits still.  She does not feel that her somnolence has worsened since starting effexor.   Hip pain- no improvement in  Right hip pain despite NSAIDS.  Reports aching pain.   Review of Systems See HPI  Past Medical History  Diagnosis Date  . Depression   . History of chicken pox   . Adenomatous colon polyp     gets colonoscopies every 3 years.    . Dysplastic nevi   . History of telogen effluvium   . Vitamin d deficiency     History   Social History  . Marital Status: Married    Spouse Name: N/A    Number of Children: 2  . Years of Education: N/A   Occupational History  . chemist    Social History Main Topics  . Smoking status: Never Smoker   . Smokeless tobacco: Never Used  . Alcohol Use: No  . Drug Use: No  . Sexually Active: Not on file   Other Topics Concern  . Not on file   Social History Narrative   Caffeine use: noneRegular exercise:  No    No past surgical history on file.  Family History  Problem Relation Age of Onset  . Cancer Mother     colon    No Known Allergies  Current Outpatient Prescriptions on File Prior to Visit  Medication Sig Dispense Refill  . BIOTIN FORTE PO Take 1 tablet by mouth daily.        . Calcium Carbonate-Vitamin D (CALCIUM 600+D) 600-400 MG-UNIT per tablet Take 1 tablet by mouth 2 (two) times daily.       . clotrimazole-betamethasone (LOTRISONE) cream Apply topically 2 (two) times daily.  30 g  0  . furosemide (LASIX) 20 MG tablet Take 1 tablet (20 mg  total) by mouth daily as needed.  30 tablet  5  . meloxicam (MOBIC) 7.5 MG tablet Take 1 tablet (7.5 mg total) by mouth daily.  15 tablet  0  . Misc Natural Products (GLUCOSAMINE CHONDROITIN ADV PO) Take 2 tablets by mouth daily.        . Multiple Vitamin (MULTIVITAMIN) tablet Take 1 tablet by mouth daily.        Marland Kitchen DISCONTD: venlafaxine XR (EFFEXOR-XR) 150 MG 24 hr capsule Take 1 capsule (150 mg total) by mouth daily.  30 capsule  1  . zolpidem (AMBIEN) 5 MG tablet Take 1 tablet (5 mg total) by mouth at bedtime as needed for sleep.  30 tablet  0    BP 100/50  Pulse 87  Temp 98.5 F (36.9 C) (Oral)  Resp 16  Ht 5\' 5"  (1.651 m)  Wt 138 lb (62.596 kg)  BMI 22.96 kg/m2  SpO2 98%       Objective:   Physical Exam  Constitutional: She is oriented to person, place, and time. She appears well-developed and well-nourished. No distress.  Musculoskeletal: She exhibits  no edema.  Neurological: She is alert and oriented to person, place, and time.  Skin: Skin is warm and dry.  Psychiatric: She has a normal mood and affect. Her behavior is normal. Judgment and thought content normal.          Assessment & Plan:

## 2011-10-18 NOTE — Patient Instructions (Addendum)
Please follow up in 3 months.  Call sooner if no improvement with ambien.

## 2011-10-18 NOTE — Assessment & Plan Note (Signed)
Unchanged despite nsaids. Refer to sports medicine for further evaluation.

## 2011-10-20 ENCOUNTER — Ambulatory Visit: Payer: BC Managed Care – PPO | Admitting: Family Medicine

## 2011-10-22 ENCOUNTER — Ambulatory Visit (INDEPENDENT_AMBULATORY_CARE_PROVIDER_SITE_OTHER): Payer: BC Managed Care – PPO | Admitting: Family Medicine

## 2011-10-22 ENCOUNTER — Encounter: Payer: Self-pay | Admitting: Family Medicine

## 2011-10-22 ENCOUNTER — Ambulatory Visit (HOSPITAL_BASED_OUTPATIENT_CLINIC_OR_DEPARTMENT_OTHER)
Admission: RE | Admit: 2011-10-22 | Discharge: 2011-10-22 | Disposition: A | Discharge status: Home / Self Care | Payer: BC Managed Care – PPO | Source: Ambulatory Visit | Attending: Family Medicine | Admitting: Family Medicine

## 2011-10-22 VITALS — BP 111/78 | HR 83 | Ht 66.0 in | Wt 138.0 lb

## 2011-10-22 DIAGNOSIS — M25551 Pain in right hip: Secondary | ICD-10-CM

## 2011-10-22 DIAGNOSIS — M25559 Pain in unspecified hip: Secondary | ICD-10-CM

## 2011-10-22 NOTE — Patient Instructions (Addendum)
Your pain is coming from your hip. Given your exam my greatest concern would be for a labral tear. If this is small usually this will improve with physical therapy, anti-inflammatories, possibly cortisone injection. Start physical therapy and home exercises for next 6 weeks. Ok to continue either meloxicam, ibuprofen, OR aleve as needed for pain and inflammation. Icing as needed. Follow up with me in 5-6 weeks. If not improving next steps would be either MRI arthrogram or hip injection by interventional radiologist.

## 2011-10-24 ENCOUNTER — Encounter: Payer: Self-pay | Admitting: Family Medicine

## 2011-10-24 NOTE — Assessment & Plan Note (Signed)
radiographs negative - no evidence early DJD, FAI, other bony abnormalities.  Given location of pain and exam, concerning for right hip labral tear.  Will try to treat conservatively with physical therapy, relative rest.  Has tried nsaids - can continue these as needed.  See instructions for further.  If not improving after 5-6 weeks would consider MR arthrogram.

## 2011-10-24 NOTE — Progress Notes (Signed)
Subjective:    Patient ID: Marie Ortega, female    DOB: 1970/12/24, 41 y.o.   MRN: [id number]  PCP: Sandford Craze  HPI 41 yo F here for right hip pain.  Patient reports no known injury, trauma, or remote problems with right hip. States for past 2 1/2 months has had right groin, buttock pain worse with different hip movements. Has a constant ache mostly in groin. Worse at nighttime. Tried ibuprofen, meloxicam without much help. No numbness or tingling. No bowel/bladder dysfunction.  Past Medical History  Diagnosis Date  . Depression   . History of chicken pox   . Adenomatous colon polyp     gets colonoscopies every 3 years.    . Dysplastic nevi   . History of telogen effluvium   . Vitamin D deficiency     Current Outpatient Prescriptions on File Prior to Visit  Medication Sig Dispense Refill  . venlafaxine XR (EFFEXOR-XR) 150 MG 24 hr capsule Take 1 capsule (150 mg total) by mouth daily.  30 capsule  2  . BIOTIN FORTE PO Take 1 tablet by mouth daily.        . Calcium Carbonate-Vitamin D (CALCIUM 600+D) 600-400 MG-UNIT per tablet Take 1 tablet by mouth 2 (two) times daily.       . furosemide (LASIX) 20 MG tablet Take 1 tablet (20 mg total) by mouth daily as needed.  30 tablet  5  . meloxicam (MOBIC) 7.5 MG tablet Take 1 tablet (7.5 mg total) by mouth daily.  15 tablet  0  . Misc Natural Products (GLUCOSAMINE CHONDROITIN ADV PO) Take 2 tablets by mouth daily.        . Multiple Vitamin (MULTIVITAMIN) tablet Take 1 tablet by mouth daily.        Marland Kitchen zolpidem (AMBIEN) 5 MG tablet Take 1 tablet (5 mg total) by mouth at bedtime as needed for sleep.  30 tablet  0    History reviewed. No pertinent past surgical history.  No Known Allergies  History   Social History  . Marital Status: Married    Spouse Name: N/A    Number of Children: 2  . Years of Education: N/A   Occupational History  . chemist    Social History Main Topics  . Smoking status: Never Smoker   .  Smokeless tobacco: Never Used  . Alcohol Use: No  . Drug Use: No  . Sexually Active: Not on file   Other Topics Concern  . Not on file   Social History Narrative   Caffeine use: noneRegular exercise:  No    Family History  Problem Relation Age of Onset  . Cancer Mother     colon  . Sudden death Neg Hx   . Hypertension Neg Hx   . Hyperlipidemia Neg Hx   . Heart attack Neg Hx   . Diabetes Neg Hx     BP 111/78  Pulse 83  Ht 5\' 6"  (1.676 m)  Wt 138 lb (62.596 kg)  BMI 22.27 kg/m2  LMP 10/02/2011  Review of Systems See HPI above.    Objective:   Physical Exam Gen: NAD  Back: No gross deformity, scoliosis. No paraspinal TTP .  No midline or bony TTP. FROM without pain. Hip abduction, adduction with pain and 4/5 strength.  Otherwise strength LEs 5/5 all muscle groups.   2+ MSRs in patellar and achilles tendons, equal bilaterally. Negative SLRs. Sensation intact to light touch bilaterally. Positive right hip logroll reproducing her pain.  Negative fabers and piriformis stretches though pain in groin with this right side.    Assessment & Plan:  1. Right hip pain - radiographs negative - no evidence early DJD, FAI, other bony abnormalities.  Given location of pain and exam, concerning for right hip labral tear.  Will try to treat conservatively with physical therapy, relative rest.  Has tried nsaids - can continue these as needed.  See instructions for further.  If not improving after 5-6 weeks would consider MR arthrogram.

## 2011-10-25 ENCOUNTER — Telehealth: Payer: Self-pay | Admitting: Gastroenterology

## 2011-10-25 NOTE — Telephone Encounter (Signed)
Patient is wanting an earlier appt due to insurance purposes.  She has been added to the cancellation list and I have checked for other MD openings.  She is also advised that she can call for cancellations

## 2011-10-26 ENCOUNTER — Ambulatory Visit: Payer: BC Managed Care – PPO | Attending: Family Medicine | Admitting: Rehabilitation

## 2011-10-26 DIAGNOSIS — IMO0001 Reserved for inherently not codable concepts without codable children: Secondary | ICD-10-CM | POA: Insufficient documentation

## 2011-10-26 DIAGNOSIS — M25559 Pain in unspecified hip: Secondary | ICD-10-CM | POA: Insufficient documentation

## 2011-10-28 ENCOUNTER — Ambulatory Visit: Payer: BC Managed Care – PPO | Admitting: Rehabilitation

## 2011-10-31 ENCOUNTER — Other Ambulatory Visit: Payer: Self-pay | Admitting: Family

## 2011-11-01 ENCOUNTER — Ambulatory Visit: Payer: BC Managed Care – PPO | Admitting: Rehabilitation

## 2011-11-01 NOTE — Telephone Encounter (Signed)
Current Effexor denied as refill just sent on 10/18/11.

## 2011-11-04 ENCOUNTER — Ambulatory Visit: Payer: BC Managed Care – PPO | Admitting: Rehabilitation

## 2011-11-09 ENCOUNTER — Ambulatory Visit (INDEPENDENT_AMBULATORY_CARE_PROVIDER_SITE_OTHER): Payer: BC Managed Care – PPO | Admitting: Family

## 2011-11-09 ENCOUNTER — Encounter: Payer: Self-pay | Admitting: Family

## 2011-11-09 ENCOUNTER — Ambulatory Visit: Payer: BC Managed Care – PPO | Admitting: Rehabilitation

## 2011-11-09 VITALS — BP 116/78 | HR 82 | Temp 98.8°F | Resp 14 | Wt 140.0 lb

## 2011-11-09 DIAGNOSIS — R110 Nausea: Secondary | ICD-10-CM | POA: Insufficient documentation

## 2011-11-09 DIAGNOSIS — R6889 Other general symptoms and signs: Secondary | ICD-10-CM

## 2011-11-09 LAB — TSH: TSH: 0.91 u[IU]/mL (ref 0.350–4.500)

## 2011-11-09 NOTE — Assessment & Plan Note (Signed)
I suspect that this is related to missed dose of effexor.  Dehydration is another possibility. Recommended that she try to avoid missed doses and stay well hydrated.  Obtain follow up TSH- she was low normal previously.

## 2011-11-09 NOTE — Patient Instructions (Addendum)
Please complete your lab work.  Follow up in 3 months. Sooner if problems/concerns.

## 2011-11-09 NOTE — Progress Notes (Signed)
Subjective:    Patient ID: Marie Ortega, female    DOB: March 20, 1970, 41 y.o.   MRN: [id number]  HPI  Marie Ortega is a 41 yr old female who presents today to discuss some symptoms she had over the weekend. She reports that on Sunday she had dizziness, nausea and felt light headed.  She has had a few similar episodes since the early summer which she attributed to dehydration. She dose report that she missed a dose of her Effexor on Friday night. She is unsure if she has missed doses of effexor prior to her other episodes.  She feels well today. She does report some issues for temperature. For example, sometimes she has chills and other times she feels warm. Review of Systems See HPI  Past Medical History  Diagnosis Date  . Depression   . History of chicken pox   . Adenomatous colon polyp     gets colonoscopies every 3 years.    . Dysplastic nevi   . History of telogen effluvium   . Vitamin D deficiency     History   Social History  . Marital Status: Married    Spouse Name: N/A    Number of Children: 2  . Years of Education: N/A   Occupational History  . chemist    Social History Main Topics  . Smoking status: Never Smoker   . Smokeless tobacco: Never Used  . Alcohol Use: No  . Drug Use: No  . Sexually Active: Not on file   Other Topics Concern  . Not on file   Social History Narrative   Caffeine use: noneRegular exercise:  No    No past surgical history on file.  Family History  Problem Relation Age of Onset  . Cancer Mother     colon  . Sudden death Neg Hx   . Hypertension Neg Hx   . Hyperlipidemia Neg Hx   . Heart attack Neg Hx   . Diabetes Neg Hx     No Known Allergies  Current Outpatient Prescriptions on File Prior to Visit  Medication Sig Dispense Refill  . BIOTIN FORTE PO Take 1 tablet by mouth daily.        . Calcium Carbonate-Vitamin D (CALCIUM 600+D) 600-400 MG-UNIT per tablet Take 1 tablet by mouth 2 (two) times daily.       . furosemide  (LASIX) 20 MG tablet Take 1 tablet (20 mg total) by mouth daily as needed.  30 tablet  5  . meloxicam (MOBIC) 7.5 MG tablet Take 1 tablet (7.5 mg total) by mouth daily.  15 tablet  0  . Misc Natural Products (GLUCOSAMINE CHONDROITIN ADV PO) Take 2 tablets by mouth daily.        . Multiple Vitamin (MULTIVITAMIN) tablet Take 1 tablet by mouth daily.        Marland Kitchen venlafaxine XR (EFFEXOR-XR) 150 MG 24 hr capsule Take 1 capsule (150 mg total) by mouth daily.  30 capsule  2  . zolpidem (AMBIEN) 5 MG tablet Take 1 tablet (5 mg total) by mouth at bedtime as needed for sleep.  30 tablet  0    BP 116/78  Pulse 82  Temp 98.8 F (37.1 C) (Oral)  Resp 14  Wt 140 lb (63.504 kg)  SpO2 99%  LMP 10/02/2011       Objective:   Physical Exam  Constitutional: She appears well-developed and well-nourished. No distress.  HENT:  Head: Normocephalic and atraumatic.  Right Ear: Tympanic membrane  and ear canal normal.  Left Ear: Tympanic membrane and ear canal normal.  Mouth/Throat: No oropharyngeal exudate, posterior oropharyngeal edema or posterior oropharyngeal erythema.  Cardiovascular: Normal rate and regular rhythm.   No murmur heard. Pulmonary/Chest: Effort normal and breath sounds normal. No respiratory distress. She has no wheezes. She has no rales. She exhibits no tenderness.          Assessment & Plan:

## 2011-11-10 ENCOUNTER — Encounter: Payer: Self-pay | Admitting: Family

## 2011-11-11 ENCOUNTER — Ambulatory Visit: Payer: BC Managed Care – PPO | Admitting: Gastroenterology

## 2011-11-12 ENCOUNTER — Ambulatory Visit: Payer: BC Managed Care – PPO | Admitting: Rehabilitation

## 2011-11-15 ENCOUNTER — Ambulatory Visit: Payer: BC Managed Care – PPO | Admitting: Rehabilitation

## 2011-11-18 ENCOUNTER — Ambulatory Visit: Payer: BC Managed Care – PPO | Admitting: Rehabilitation

## 2011-11-24 ENCOUNTER — Ambulatory Visit: Payer: BC Managed Care – PPO | Attending: Family Medicine | Admitting: Rehabilitation

## 2011-11-24 DIAGNOSIS — IMO0001 Reserved for inherently not codable concepts without codable children: Secondary | ICD-10-CM | POA: Insufficient documentation

## 2011-11-24 DIAGNOSIS — M25559 Pain in unspecified hip: Secondary | ICD-10-CM | POA: Insufficient documentation

## 2011-11-29 ENCOUNTER — Ambulatory Visit: Payer: BC Managed Care – PPO | Admitting: Rehabilitation

## 2011-11-30 ENCOUNTER — Ambulatory Visit (INDEPENDENT_AMBULATORY_CARE_PROVIDER_SITE_OTHER): Payer: BC Managed Care – PPO | Admitting: Family Medicine

## 2011-11-30 ENCOUNTER — Encounter: Payer: Self-pay | Admitting: Family Medicine

## 2011-11-30 VITALS — BP 110/70 | Ht 65.0 in | Wt 138.0 lb

## 2011-11-30 DIAGNOSIS — M25559 Pain in unspecified hip: Secondary | ICD-10-CM

## 2011-11-30 DIAGNOSIS — M25551 Pain in right hip: Secondary | ICD-10-CM

## 2011-11-30 NOTE — Progress Notes (Addendum)
Subjective:    Patient ID: Marie Ortega, female    DOB: 1970/02/27, 41 y.o.   MRN: [id number]  PCP: Sandford Craze  Hip Pain    41 yo F here for f/u right hip pain.  10/4: Patient reports no known injury, trauma, or remote problems with right hip. States for past 2 1/2 months has had right groin, buttock pain worse with different hip movements. Has a constant ache mostly in groin. Worse at nighttime. Tried ibuprofen, meloxicam without much help. No numbness or tingling. No bowel/bladder dysfunction.  11/12: Patient has noticed improvement since starting physical therapy. Compliant with HEP as well. No longer has sharp pain. Still feels dull ache from anterior through to buttock right side. Not taking any medications now. No back pain. No radiation into legs. No bowel/bladder dysfunction. No numbness/tingling.  Past Medical History  Diagnosis Date  . Depression   . History of chicken pox   . Adenomatous colon polyp     gets colonoscopies every 3 years.    . Dysplastic nevi   . History of telogen effluvium   . Vitamin D deficiency     Current Outpatient Prescriptions on File Prior to Visit  Medication Sig Dispense Refill  . BIOTIN FORTE PO Take 1 tablet by mouth daily.        . Calcium Carbonate-Vitamin D (CALCIUM 600+D) 600-400 MG-UNIT per tablet Take 1 tablet by mouth 2 (two) times daily.       . furosemide (LASIX) 20 MG tablet Take 1 tablet (20 mg total) by mouth daily as needed.  30 tablet  5  . meloxicam (MOBIC) 7.5 MG tablet Take 1 tablet (7.5 mg total) by mouth daily.  15 tablet  0  . Misc Natural Products (GLUCOSAMINE CHONDROITIN ADV PO) Take 2 tablets by mouth daily.        . Multiple Vitamin (MULTIVITAMIN) tablet Take 1 tablet by mouth daily.        Marland Kitchen venlafaxine XR (EFFEXOR-XR) 150 MG 24 hr capsule Take 1 capsule (150 mg total) by mouth daily.  30 capsule  2  . zolpidem (AMBIEN) 5 MG tablet Take 1 tablet (5 mg total) by mouth at bedtime as needed  for sleep.  30 tablet  0    History reviewed. No pertinent past surgical history.  No Known Allergies  History   Social History  . Marital Status: Married    Spouse Name: N/A    Number of Children: 2  . Years of Education: N/A   Occupational History  . chemist    Social History Main Topics  . Smoking status: Never Smoker   . Smokeless tobacco: Never Used  . Alcohol Use: No  . Drug Use: No  . Sexually Active: Not on file   Other Topics Concern  . Not on file   Social History Narrative   Caffeine use: noneRegular exercise:  No    Family History  Problem Relation Age of Onset  . Cancer Mother     colon  . Sudden death Neg Hx   . Hypertension Neg Hx   . Hyperlipidemia Neg Hx   . Heart attack Neg Hx   . Diabetes Neg Hx     BP 110/70  Ht 5\' 5"  (1.651 m)  Wt 138 lb (62.596 kg)  BMI 22.96 kg/m2  Review of Systems  See HPI above.    Objective:   Physical Exam  Gen: NAD  Back: No gross deformity, scoliosis. No paraspinal TTP .  No midline or bony TTP. FROM without pain. 5/5 strength without pain with hip abduction, adduction, much improved.  Strength LEs 5/5 all muscle groups.   2+ MSRs in patellar and achilles tendons, equal bilaterally. Negative SLRs. Sensation intact to light touch bilaterally. Minimally positive right hip logroll reproducing her pain - much improved. Negative fabers and piriformis stretches and no pain.    Assessment & Plan:  1. Right hip pain - At prior visit radiographs negative - no evidence early DJD, FAI, other bony abnormalities.  Her exam is much improved though continues to struggle with dull hip pain radiating to back.  Concerning for labral tear.  Discussed options - injection, MR arthrogram, continued PT.  Will continue with PT and HEP as she has had good success to this point.  F/u in 6 weeks.  Consider MR arthrogram as next step if not continuing to improve.  Addendum: MRI results reviewed and discussed with patient.  She  does have a small labral tear on MRI.  As previously discussed will go ahead with intraarticular cortisone injection for diagnostic and therapeutic purposes and see her back 2 weeks later.  If gives her temporary relief would refer for arthroscopic debridement.

## 2011-11-30 NOTE — Assessment & Plan Note (Signed)
At prior visit radiographs negative - no evidence early DJD, FAI, other bony abnormalities.  Her exam is much improved though continues to struggle with dull hip pain radiating to back.  Concerning for labral tear.  Discussed options - injection, MR arthrogram, continued PT.  Will continue with PT and HEP as she has had good success to this point.  F/u in 6 weeks.  Consider MR arthrogram as next step if not continuing to improve.

## 2011-12-01 ENCOUNTER — Encounter: Payer: BC Managed Care – PPO | Admitting: Rehabilitation

## 2011-12-02 ENCOUNTER — Ambulatory Visit: Payer: BC Managed Care – PPO | Admitting: Rehabilitation

## 2011-12-06 ENCOUNTER — Encounter: Payer: BC Managed Care – PPO | Admitting: Rehabilitation

## 2011-12-08 ENCOUNTER — Encounter: Payer: BC Managed Care – PPO | Admitting: Rehabilitation

## 2011-12-09 ENCOUNTER — Ambulatory Visit: Payer: BC Managed Care – PPO | Admitting: Rehabilitation

## 2011-12-13 ENCOUNTER — Other Ambulatory Visit: Payer: Self-pay | Admitting: *Deleted

## 2011-12-13 DIAGNOSIS — M25551 Pain in right hip: Secondary | ICD-10-CM

## 2011-12-14 ENCOUNTER — Ambulatory Visit: Payer: BC Managed Care – PPO | Admitting: Rehabilitation

## 2011-12-20 ENCOUNTER — Ambulatory Visit: Payer: BC Managed Care – PPO | Attending: Family Medicine | Admitting: Rehabilitation

## 2011-12-20 DIAGNOSIS — IMO0001 Reserved for inherently not codable concepts without codable children: Secondary | ICD-10-CM | POA: Insufficient documentation

## 2011-12-20 DIAGNOSIS — M25559 Pain in unspecified hip: Secondary | ICD-10-CM | POA: Insufficient documentation

## 2011-12-22 ENCOUNTER — Ambulatory Visit (INDEPENDENT_AMBULATORY_CARE_PROVIDER_SITE_OTHER): Payer: BC Managed Care – PPO | Admitting: Gastroenterology

## 2011-12-22 ENCOUNTER — Ambulatory Visit
Admission: RE | Admit: 2011-12-22 | Discharge: 2011-12-22 | Disposition: A | Discharge status: Home / Self Care | Payer: BC Managed Care – PPO | Source: Ambulatory Visit | Attending: Family Medicine | Admitting: Family Medicine

## 2011-12-22 ENCOUNTER — Encounter: Payer: Self-pay | Admitting: Gastroenterology

## 2011-12-22 VITALS — BP 100/50 | HR 88 | Ht 64.5 in | Wt 139.0 lb

## 2011-12-22 DIAGNOSIS — K5900 Constipation, unspecified: Secondary | ICD-10-CM

## 2011-12-22 DIAGNOSIS — Z800 Family history of malignant neoplasm of digestive organs: Secondary | ICD-10-CM

## 2011-12-22 DIAGNOSIS — Z86010 Personal history of colonic polyps: Secondary | ICD-10-CM

## 2011-12-22 DIAGNOSIS — M25551 Pain in right hip: Secondary | ICD-10-CM

## 2011-12-22 MED ORDER — IOHEXOL 180 MG/ML  SOLN
15.0000 mL | Freq: Once | INTRAMUSCULAR | Status: AC | PRN
  Administered 2011-12-22: 15 mL via INTRA_ARTICULAR

## 2011-12-22 MED ORDER — PEG-KCL-NACL-NASULF-NA ASC-C 100 G PO SOLR: 1.0000 | Freq: Once | ORAL | Status: DC

## 2011-12-22 NOTE — Patient Instructions (Addendum)
You have been scheduled for a colonoscopy with propofol. Please follow written instructions given to you at your visit today.  Please pick up your prep kit at the pharmacy within the next 1-3 days. If you use inhalers (even only as needed) or a CPAP machine, please bring them with you on the day of your procedure.  

## 2011-12-22 NOTE — Progress Notes (Signed)
History of Present Illness: This is a 41 year old female with a personal history of adenomatous colon polyps and a family history of colon cancer. Her mother developed colon cancer at age 8. The patient previously underwent colonoscopy in [place], [place] in November 2010 and 3 polyps were found. I do not have the pathology report however the patient reports that the polyps were adenomatous. She has frequent problems with constipation and occasionally takes MiraLax. The symptoms have not changed. Denies weight loss, abdominal pain, diarrhea, change in stool caliber, melena, hematochezia, nausea, vomiting, dysphagia, reflux symptoms, chest pain.  Review of Systems: Pertinent positive and negative review of systems were noted in the above HPI section. All other review of systems were otherwise negative.  Current Medications, Allergies, Past Medical History, Past Surgical History, Family History and Social History were reviewed in [employer] record.  Physical Exam: General: Well developed , well nourished, no acute distress Head: Normocephalic and atraumatic Eyes:  sclerae anicteric, EOMI Ears: Normal auditory acuity Mouth: No deformity or lesions Neck: Supple, no masses or thyromegaly Lungs: Clear throughout to auscultation Heart: Regular rate and rhythm; no murmurs, rubs or bruits Abdomen: Soft, non tender and non distended. No masses, hepatosplenomegaly or hernias noted. Normal Bowel sounds Rectal: deferred to colonoscopy Musculoskeletal: Symmetrical with no gross deformities  Skin: No lesions on visible extremities Pulses:  Normal pulses noted Extremities: No clubbing, cyanosis, edema or deformities noted Neurological: Alert oriented x 4, grossly nonfocal Cervical Nodes:  No significant cervical adenopathy Inguinal Nodes: No significant inguinal adenopathy Psychological:  Alert and cooperative. Normal mood and affect  Assessment and Recommendations:  1.  Constipation. Increase dietary fiber and daily water intake. MiraLax once or twice daily as needed and if fiber supplementation has not adequately control symptoms.  2. Personal history of adenomatous colon polyps and family history of colon cancer in first degree relative. Schedule colonoscopy as previously recommended at 3 years. The risks, benefits, and alternatives to colonoscopy with possible biopsy and possible polypectomy were discussed with the patient and they consent to proceed.

## 2011-12-27 ENCOUNTER — Ambulatory Visit: Payer: BC Managed Care – PPO | Admitting: Rehabilitation

## 2011-12-27 ENCOUNTER — Encounter: Payer: Self-pay | Admitting: *Deleted

## 2011-12-27 DIAGNOSIS — M25551 Pain in right hip: Secondary | ICD-10-CM

## 2012-01-06 ENCOUNTER — Ambulatory Visit
Admission: RE | Admit: 2012-01-06 | Discharge: 2012-01-06 | Disposition: A | Discharge status: Home / Self Care | Payer: BC Managed Care – PPO | Source: Ambulatory Visit | Attending: Family Medicine | Admitting: Family Medicine

## 2012-01-06 DIAGNOSIS — M25551 Pain in right hip: Secondary | ICD-10-CM

## 2012-01-06 MED ORDER — IOHEXOL 180 MG/ML  SOLN
1.0000 mL | Freq: Once | INTRAMUSCULAR | Status: AC | PRN
  Administered 2012-01-06: 1 mL via INTRA_ARTICULAR

## 2012-01-06 MED ORDER — METHYLPREDNISOLONE ACETATE 40 MG/ML INJ SUSP (RADIOLOG
120.0000 mg | Freq: Once | INTRAMUSCULAR | Status: AC
  Administered 2012-01-06: 120 mg via INTRA_ARTICULAR

## 2012-01-20 ENCOUNTER — Encounter: Payer: Self-pay | Admitting: Gastroenterology

## 2012-01-24 ENCOUNTER — Encounter: Payer: Self-pay | Admitting: Family Medicine

## 2012-01-24 ENCOUNTER — Ambulatory Visit (INDEPENDENT_AMBULATORY_CARE_PROVIDER_SITE_OTHER): Payer: BC Managed Care – PPO | Admitting: Family Medicine

## 2012-01-24 ENCOUNTER — Ambulatory Visit: Payer: BC Managed Care – PPO | Admitting: Family

## 2012-01-24 VITALS — BP 115/73 | HR 99 | Ht 66.0 in | Wt 140.0 lb

## 2012-01-24 DIAGNOSIS — M25559 Pain in unspecified hip: Secondary | ICD-10-CM

## 2012-01-24 DIAGNOSIS — M25551 Pain in right hip: Secondary | ICD-10-CM

## 2012-01-25 ENCOUNTER — Encounter: Payer: Self-pay | Admitting: Family Medicine

## 2012-01-25 NOTE — Assessment & Plan Note (Signed)
Patient's MRI consistent with a small labral tear.  No other abnormalities noted on this.  Has had improvement with intraarticular injection as well though still with dull pain in hip.  No evidence of lumbar or intraabdominal pathology based on history and exam.  No red flag signs or symptoms either.  Discussed options with her.  She would like to continue with home exercises at this point and give the cortisone injection more time.  I do not think at this point it would be worthwhile imaging abdomen or low back.  If not improving next step would be to refer to ortho to discuss possibility of hip arthroscopy.

## 2012-01-25 NOTE — Progress Notes (Addendum)
Subjective:    Patient ID: Marie Ortega, female    DOB: Aug 15, 1970, 42 y.o.   MRN: [id number]  PCP: Sandford Craze  Hip Pain    42 yo F here for f/u right hip pain.  10/4: Patient reports no known injury, trauma, or remote problems with right hip. States for past 2 1/2 months has had right groin, buttock pain worse with different hip movements. Has a constant ache mostly in groin. Worse at nighttime. Tried ibuprofen, meloxicam without much help. No numbness or tingling. No bowel/bladder dysfunction.  11/12: Patient has noticed improvement since starting physical therapy. Compliant with HEP as well. No longer has sharp pain. Still feels dull ache from anterior through to buttock right side. Not taking any medications now. No back pain. No radiation into legs. No bowel/bladder dysfunction. No numbness/tingling.  01/24/12: Patient is now 2 weeks out from intraarticular cortisone injection for right hip labral tear. She reports sharp pain has resolved from the shot. No longer gets a pinching when right leg is behind her. Still having dull right groin pain No back pain, radiation into leg. No bowel/bladder dysfunction. No numbness or tingling. No diarrhea, bloody stool, abdominal pain. No fevers, chills, sweats, other complaints.  Past Medical History  Diagnosis Date  . Depression   . History of chicken pox   . Adenomatous colon polyp     gets colonoscopies every 3 years.    . Dysplastic nevi   . History of telogen effluvium   . Vitamin D deficiency     Current Outpatient Prescriptions on File Prior to Visit  Medication Sig Dispense Refill  . BIOTIN FORTE PO Take 1 tablet by mouth daily.        . Calcium Carbonate-Vitamin D (CALCIUM 600+D) 600-400 MG-UNIT per tablet Take 1 tablet by mouth 2 (two) times daily.       . furosemide (LASIX) 20 MG tablet Take 1 tablet (20 mg total) by mouth daily as needed.  30 tablet  5  . meloxicam (MOBIC) 7.5 MG tablet Take 1  tablet (7.5 mg total) by mouth daily.  15 tablet  0  . Misc Natural Products (GLUCOSAMINE CHONDROITIN ADV PO) Take 2 tablets by mouth daily.        . Multiple Vitamin (MULTIVITAMIN) tablet Take 1 tablet by mouth daily.        . peg 3350 powder (MOVIPREP) 100 G SOLR Take 1 kit (100 g total) by mouth once.  1 kit  0  . venlafaxine XR (EFFEXOR-XR) 150 MG 24 hr capsule Take 1 capsule (150 mg total) by mouth daily.  30 capsule  2  . zolpidem (AMBIEN) 5 MG tablet Take 1 tablet (5 mg total) by mouth at bedtime as needed for sleep.  30 tablet  0    History reviewed. No pertinent past surgical history.  No Known Allergies  History   Social History  . Marital Status: Married    Spouse Name: N/A    Number of Children: 2  . Years of Education: N/A   Occupational History  . chemist    Social History Main Topics  . Smoking status: Never Smoker   . Smokeless tobacco: Never Used  . Alcohol Use: No  . Drug Use: No  . Sexually Active: Not on file   Other Topics Concern  . Not on file   Social History Narrative   Caffeine use: noneRegular exercise:  No    Family History  Problem Relation Age of Onset  .  Colon cancer Mother 73  . Sudden death Neg Hx   . Hypertension Neg Hx   . Hyperlipidemia Neg Hx   . Heart attack Neg Hx   . Diabetes Neg Hx   . Colon polyps Maternal Grandmother     BP 115/73  Pulse 99  Ht 5\' 6"  (1.676 m)  Wt 140 lb (63.504 kg)  BMI 22.60 kg/m2  Review of Systems  See HPI above.    Objective:   Physical Exam  Gen: NAD  Back: No gross deformity, scoliosis. No paraspinal TTP .  No midline or bony TTP. FROM without pain. 5/5 strength without pain with hip abduction, adduction.  Strength LEs 5/5 all muscle groups.   2+ MSRs in patellar and achilles tendons, equal bilaterally. Negative SLRs. Sensation intact to light touch bilaterally. Negative right hip logroll reproducing her pain - much improved. Negative fabers and piriformis stretches and no  pain.    Assessment & Plan:  1. Right hip pain - Patient's MRI consistent with a small labral tear.  No other abnormalities noted on this.  Has had improvement with intraarticular injection as well though still with dull pain in hip.  No evidence of lumbar or intraabdominal pathology based on history and exam.  No red flag signs or symptoms either.  Discussed options with her.  She would like to continue with home exercises at this point and give the cortisone injection more time.  I do not think at this point it would be worthwhile imaging abdomen or low back.  If not improving next step would be to refer to ortho to discuss possibility of hip arthroscopy.  Addendum 1/24:  Patient sent [place] an message through [facility] noting that her pain has returned to the way it was prior to the intraarticular right hip injection.  She would like to go ahead with referral to ortho which I think is reasonable at this point to consider hip arthroscopy.  Order placed.

## 2012-02-02 ENCOUNTER — Ambulatory Visit: Payer: BC Managed Care – PPO | Attending: Family Medicine | Admitting: Rehabilitation

## 2012-02-02 DIAGNOSIS — M25559 Pain in unspecified hip: Secondary | ICD-10-CM | POA: Insufficient documentation

## 2012-02-02 DIAGNOSIS — IMO0001 Reserved for inherently not codable concepts without codable children: Secondary | ICD-10-CM | POA: Insufficient documentation

## 2012-02-04 ENCOUNTER — Encounter: Payer: Self-pay | Admitting: Gastroenterology

## 2012-02-04 ENCOUNTER — Ambulatory Visit (AMBULATORY_SURGERY_CENTER): Payer: BC Managed Care – PPO | Admitting: Gastroenterology

## 2012-02-04 VITALS — BP 113/76 | HR 68 | Temp 98.4°F | Resp 15 | Ht 64.0 in | Wt 139.0 lb

## 2012-02-04 DIAGNOSIS — Z800 Family history of malignant neoplasm of digestive organs: Secondary | ICD-10-CM

## 2012-02-04 DIAGNOSIS — Z1211 Encounter for screening for malignant neoplasm of colon: Secondary | ICD-10-CM

## 2012-02-04 DIAGNOSIS — Z86010 Personal history of colonic polyps: Secondary | ICD-10-CM

## 2012-02-04 MED ORDER — SODIUM CHLORIDE 0.9 % IV SOLN: 500.0000 mL | INTRAVENOUS | Status: DC

## 2012-02-04 NOTE — Op Note (Signed)
 [facility] [address] [place], [address]   COLONOSCOPY PROCEDURE REPORT  PATIENT: Marie Ortega, Marie Ortega  MR#: [id number] BIRTHDATE: 1970/04/13 , 41  yrs. old GENDER: Female ENDOSCOPIST: Meryl Dare, MD, [facility] REFERRED QI:ONGEXBM Peggyann Juba, FNP PROCEDURE DATE:  02/04/2012 PROCEDURE:   Colonoscopy, screening ASA CLASS:   Class II INDICATIONS:Patient's personal history of adenomatous colon polyps and patient's immediate family history of colon cancer. MEDICATIONS: MAC sedation, administered by CRNA and propofol (Diprivan) 300mg  IV DESCRIPTION OF PROCEDURE:   After the risks benefits and alternatives of the procedure were thoroughly explained, informed consent was obtained.  A digital rectal exam revealed no abnormalities of the rectum.   The LB [id number]-[id number]  endoscope was introduced through the anus and advanced to the cecum, which was identified by both the appendix and ileocecal valve. No adverse events experienced.   The quality of the prep was excellent, using MoviPrep  The instrument was then slowly withdrawn as the colon was fully examined.  COLON FINDINGS: A normal appearing cecum, ileocecal valve, and appendiceal orifice were identified.  The ascending, hepatic flexure, transverse, splenic flexure, descending, sigmoid colon and rectum appeared unremarkable.  No polyps or cancers were seen. Retroflexed views revealed no abnormalities. The time to cecum=4 minutes 21 seconds.  Withdrawal time=10 minutes 08 seconds.  The scope was withdrawn and the procedure completed.  COMPLICATIONS: There were no complications.  ENDOSCOPIC IMPRESSION: 1.  Normal colon  RECOMMENDATIONS: 1.  Repeat Colonoscopy in 3 years.   eSigned:  Meryl Dare, MD, [facility] 02/04/2012 3:07 PM

## 2012-02-04 NOTE — Patient Instructions (Signed)
YOU HAD AN ENDOSCOPIC PROCEDURE TODAY AT THE [facility]: Refer to the procedure report that was given to you for any specific questions about what was found during the examination.  If the procedure report does not answer your questions, please call your gastroenterologist to clarify.  If you requested that your care partner not be given the details of your procedure findings, then the procedure report has been included in a sealed envelope for you to review at your convenience later.  YOU SHOULD EXPECT: Some feelings of bloating in the abdomen. Passage of more gas than usual.  Walking can help get rid of the air that was put into your GI tract during the procedure and reduce the bloating. If you had a lower endoscopy (such as a colonoscopy or flexible sigmoidoscopy) you may notice spotting of blood in your stool or on the toilet paper. If you underwent a bowel prep for your procedure, then you may not have a normal bowel movement for a few days.  DIET: Your first meal following the procedure should be a light meal and then it is ok to progress to your normal diet.  A half-sandwich or bowl of soup is an example of a good first meal.  Heavy or fried foods are harder to digest and may make you feel nauseous or bloated.  Likewise meals heavy in dairy and vegetables can cause extra gas to form and this can also increase the bloating.  Drink plenty of fluids but you should avoid alcoholic beverages for 24 hours.  ACTIVITY: Your care partner should take you home directly after the procedure.  You should plan to take it easy, moving slowly for the rest of the day.  You can resume normal activity the day after the procedure however you should NOT DRIVE or use heavy machinery for 24 hours (because of the sedation medicines used during the test).    SYMPTOMS TO REPORT IMMEDIATELY: A gastroenterologist can be reached at any hour.  During normal business hours, 8:30 AM to 5:00 PM Monday through Friday,  call [phone].  After hours and on weekends, please call the GI answering service at [phone] who will take a message and have the physician on call contact you.   Following lower endoscopy (colonoscopy or flexible sigmoidoscopy):  Excessive amounts of blood in the stool  Significant tenderness or worsening of abdominal pains  Swelling of the abdomen that is new, acute  Fever of 100F or higher    FOLLOW UP: If any biopsies were taken you will be contacted by phone or by letter within the next 1-3 weeks.  Call your gastroenterologist if you have not heard about the biopsies in 3 weeks.  Our staff will call the home number listed on your records the next business day following your procedure to check on you and address any questions or concerns that you may have at that time regarding the information given to you following your procedure. This is a courtesy call and so if there is no answer at the home number and we have not heard from you through the emergency physician on call, we will assume that you have returned to your regular daily activities without incident.  SIGNATURES/CONFIDENTIALITY: You and/or your care partner have signed paperwork which will be entered into your electronic medical record.  These signatures attest to the fact that that the information above on your After Visit Summary has been reviewed and is understood.  Full responsibility of the confidentiality   of this discharge information lies with you and/or your care-partner.     

## 2012-02-04 NOTE — Progress Notes (Signed)
Patient did not experience any of the following events: a burn prior to discharge; a fall within the facility; wrong site/side/patient/procedure/implant event; or a hospital transfer or hospital admission upon discharge from the facility. (G8907) Patient did not have preoperative order for IV antibiotic SSI prophylaxis. (G8918)  

## 2012-02-07 ENCOUNTER — Telehealth: Payer: Self-pay | Admitting: *Deleted

## 2012-02-07 NOTE — Telephone Encounter (Signed)
  Follow up Call-  Call back number 02/04/2012  Post procedure Call Back phone  # [phone]  Permission to leave phone message Yes     Patient questions:  Do you have a fever, pain , or abdominal swelling? no Pain Score  0 *  Have you tolerated food without any problems? yes  Have you been able to return to your normal activities? yes  Do you have any questions about your discharge instructions: Diet   no Medications  no Follow up visit  no  Do you have questions or concerns about your Care? no  Actions: * If pain score is 4 or above: No action needed, pain <4.

## 2012-02-09 ENCOUNTER — Other Ambulatory Visit: Payer: Self-pay | Admitting: Family

## 2012-02-11 ENCOUNTER — Encounter: Payer: Self-pay | Admitting: Family Medicine

## 2012-02-11 NOTE — Addendum Note (Signed)
Addended by: Lenda Kelp on: 02/11/2012 09:43 AM   Modules accepted: Orders

## 2012-02-17 ENCOUNTER — Encounter: Payer: Self-pay | Admitting: Family Medicine

## 2012-02-18 ENCOUNTER — Encounter: Payer: Self-pay | Admitting: Family Medicine

## 2012-03-01 ENCOUNTER — Encounter: Payer: BC Managed Care – PPO | Admitting: Family

## 2012-03-13 ENCOUNTER — Encounter: Payer: Self-pay | Admitting: Family

## 2012-03-13 ENCOUNTER — Ambulatory Visit (INDEPENDENT_AMBULATORY_CARE_PROVIDER_SITE_OTHER): Payer: BC Managed Care – PPO | Admitting: Family

## 2012-03-13 VITALS — BP 102/70 | HR 100 | Temp 98.4°F | Resp 16 | Ht 65.0 in | Wt 139.0 lb

## 2012-03-13 DIAGNOSIS — Z0000 Encounter for general adult medical examination without abnormal findings: Secondary | ICD-10-CM | POA: Insufficient documentation

## 2012-03-13 LAB — BASIC METABOLIC PANEL WITH GFR
CO2: 32 mEq/L (ref 19–32)
Calcium: 9.8 mg/dL (ref 8.4–10.5)
Chloride: 101 mEq/L (ref 96–112)
Creat: 0.73 mg/dL (ref 0.50–1.10)
Glucose, Bld: 81 mg/dL (ref 70–99)

## 2012-03-13 LAB — HEPATIC FUNCTION PANEL
Albumin: 4.8 g/dL (ref 3.5–5.2)
Bilirubin, Direct: 0.1 mg/dL (ref 0.0–0.3)
Total Bilirubin: 0.4 mg/dL (ref 0.3–1.2)

## 2012-03-13 LAB — CBC WITH DIFFERENTIAL/PLATELET
Basophils Absolute: 0 10*3/uL (ref 0.0–0.1)
Eosinophils Absolute: 0 10*3/uL (ref 0.0–0.7)
Eosinophils Relative: 0 % (ref 0–5)
HCT: 39.8 % (ref 36.0–46.0)
Lymphocytes Relative: 24 % (ref 12–46)
Lymphs Abs: 1.8 10*3/uL (ref 0.7–4.0)
MCH: 30.6 pg (ref 26.0–34.0)
MCV: 89.4 fL (ref 78.0–100.0)
Monocytes Absolute: 0.5 10*3/uL (ref 0.1–1.0)
RDW: 13.4 % (ref 11.5–15.5)
WBC: 7.7 10*3/uL (ref 4.0–10.5)

## 2012-03-13 LAB — LIPID PANEL
Cholesterol: 197 mg/dL (ref 0–200)
LDL Cholesterol: 114 mg/dL — ABNORMAL HIGH (ref 0–99)
Total CHOL/HDL Ratio: 2.9 Ratio
VLDL: 14 mg/dL (ref 0–40)

## 2012-03-13 LAB — TSH: TSH: 1.155 u[IU]/mL (ref 0.350–4.500)

## 2012-03-13 NOTE — Patient Instructions (Addendum)
Please complete your blood work prior to leaving.  Follow up in 6 months.

## 2012-03-13 NOTE — Assessment & Plan Note (Addendum)
Plan routine pap next year.  Order mammogram. Discussed healthy diet/exercise, immunizations reviewed and up to date.  Obtain fasting lab work.

## 2012-03-13 NOTE — Progress Notes (Signed)
Subjective:    Patient ID: Marie Ortega, female    DOB: 12/24/1970, 42 y.o.   MRN: [id number]  HPI  Patient presents today for complete physical.  Immunizations: up to date Diet: trying to eat healthy. Exercise: tries to walk. Pap Smear: Last pap 18 months ago.   Mammogram:   Pt here for fasting physical. Pt would like [place] to do pap smear. Last pap 08/2010, normal. Pt had mammogram 3 years ago. No record of previous EKG on file.   Review of Systems  Constitutional: Negative for unexpected weight change.  HENT: Negative for hearing loss.   Eyes: Negative for visual disturbance.  Respiratory: Negative for cough.   Cardiovascular: Negative for chest pain.  Gastrointestinal: Positive for constipation. Negative for nausea, vomiting and diarrhea.  Genitourinary: Negative for dysuria.  Musculoskeletal:       Occasional knee/hip pain  Skin: Negative for rash.  Neurological: Negative for headaches.  Hematological: Negative for adenopathy.  Psychiatric/Behavioral:       Anxiety well controlled on effexor   Past Medical History  Diagnosis Date  . Depression   . History of chicken pox   . Adenomatous colon polyp     gets colonoscopies every 3 years.    . Dysplastic nevi   . History of telogen effluvium   . Vitamin D deficiency     History   Social History  . Marital Status: Married    Spouse Name: N/A    Number of Children: 2  . Years of Education: N/A   Occupational History  . chemist    Social History Main Topics  . Smoking status: Never Smoker   . Smokeless tobacco: Never Used  . Alcohol Use: No  . Drug Use: No  . Sexually Active: Not on file   Other Topics Concern  . Not on file   Social History Narrative   Caffeine use: none   Regular exercise:  No          History reviewed. No pertinent past surgical history.  Family History  Problem Relation Age of Onset  . Colon cancer Mother 61  . Sudden death Neg Hx   . Hypertension Neg Hx   .  Hyperlipidemia Neg Hx   . Heart attack Neg Hx   . Diabetes Neg Hx   . Colon polyps Maternal Grandmother     No Known Allergies  Current Outpatient Prescriptions on File Prior to Visit  Medication Sig Dispense Refill  . BIOTIN FORTE PO Take 1 tablet by mouth daily.        . Calcium Carbonate-Vitamin D (CALCIUM 600+D) 600-400 MG-UNIT per tablet Take 1 tablet by mouth 2 (two) times daily.       . furosemide (LASIX) 20 MG tablet Take 1 tablet (20 mg total) by mouth daily as needed.  30 tablet  5  . Misc Natural Products (GLUCOSAMINE CHONDROITIN ADV PO) Take 2 tablets by mouth daily.        . Multiple Vitamin (MULTIVITAMIN) tablet Take 1 tablet by mouth daily.        Marland Kitchen venlafaxine XR (EFFEXOR-XR) 150 MG 24 hr capsule TAKE 1 CAPSULE EVERY DAY  30 capsule  2  . zolpidem (AMBIEN) 5 MG tablet Take 1 tablet (5 mg total) by mouth at bedtime as needed for sleep.  30 tablet  0   No current facility-administered medications on file prior to visit.    BP 102/70  Pulse 100  Temp(Src) 98.4 F (36.9 C) (Oral)  Resp 16  Ht 5\' 5"  (1.651 m)  Wt 139 lb (63.05 kg)  BMI 23.13 kg/m2  SpO2 98%  LMP 02/28/2012       Objective:   Physical Exam  Physical Exam  Constitutional: She is oriented to person, place, and time. She appears well-developed and well-nourished. No distress.  HENT:  Head: Normocephalic and atraumatic.  Right Ear: Tympanic membrane and ear canal normal.  Left Ear: Tympanic membrane and ear canal normal.  Mouth/Throat: Oropharynx is clear and moist.  Eyes: Pupils are equal, round, and reactive to light. No scleral icterus.  Neck: Normal range of motion. No thyromegaly present.  Cardiovascular: Normal rate and regular rhythm.   No murmur heard. Pulmonary/Chest: Effort normal and breath sounds normal. No respiratory distress. He has no wheezes. She has no rales. She exhibits no tenderness.  Abdominal: Soft. Bowel sounds are normal. He exhibits no distension and no mass. There is  no tenderness. There is no rebound and no guarding.  Musculoskeletal: She exhibits no edema.  Lymphadenopathy:    She has no cervical adenopathy.  Neurological: She is alert and oriented to person, place, and time. She exhibits normal muscle tone. Coordination normal.  Skin: Skin is warm and dry.  Psychiatric: She has a normal mood and affect. Her behavior is normal. Judgment and thought content normal.  Breasts: Examined lying Right: Without masses, retractions, discharge or axillary adenopathy.  Left: Without masses, retractions, discharge or axillary adenopathy.  Pelvic: Deferred          Assessment & Plan:          Assessment & Plan:

## 2012-03-14 LAB — URINALYSIS
Glucose, UA: NEGATIVE mg/dL
Hgb urine dipstick: NEGATIVE
Leukocytes, UA: NEGATIVE
Nitrite: NEGATIVE
Specific Gravity, Urine: 1.028 (ref 1.005–1.030)
pH: 5.5 (ref 5.0–8.0)

## 2012-05-02 ENCOUNTER — Ambulatory Visit: Payer: BC Managed Care – PPO | Admitting: Rehabilitation

## 2012-05-19 ENCOUNTER — Other Ambulatory Visit: Payer: Self-pay | Admitting: Family

## 2012-06-02 ENCOUNTER — Telehealth: Payer: Self-pay | Admitting: Family

## 2012-06-02 MED ORDER — FUROSEMIDE 20 MG PO TABS: 20.0000 mg | ORAL_TABLET | Freq: Every day | ORAL | Status: DC | PRN

## 2012-06-02 NOTE — Telephone Encounter (Signed)
Refill- furosemide 20mg  tablet. Take one tablet(20mg  total) by mouth daily as needed. Qty 30

## 2012-06-02 NOTE — Telephone Encounter (Signed)
Refill sent.

## 2012-06-07 ENCOUNTER — Ambulatory Visit (INDEPENDENT_AMBULATORY_CARE_PROVIDER_SITE_OTHER): Payer: BC Managed Care – PPO | Admitting: Family Medicine

## 2012-06-07 ENCOUNTER — Encounter: Payer: Self-pay | Admitting: Family Medicine

## 2012-06-07 VITALS — BP 120/76 | HR 92 | Ht 65.0 in | Wt 138.0 lb

## 2012-06-07 DIAGNOSIS — M25522 Pain in left elbow: Secondary | ICD-10-CM

## 2012-06-07 DIAGNOSIS — M25529 Pain in unspecified elbow: Secondary | ICD-10-CM

## 2012-06-08 ENCOUNTER — Encounter: Payer: Self-pay | Admitting: Family Medicine

## 2012-06-08 DIAGNOSIS — M25522 Pain in left elbow: Secondary | ICD-10-CM | POA: Insufficient documentation

## 2012-06-08 NOTE — Progress Notes (Signed)
Subjective:    Patient ID: Marie Ortega, female    DOB: [birth year], 42 y.o.   MRN: [id number]  PCP: Sandford Craze  HPI 42 yo F here for left elbow injury.  Patient reports about 1 1/2 weeks ago while carrying some items she accidentally hit a doorway with her left elbow. No swelling or bruising. Still having pain especially when extending elbow. No prior injuries to this elbow. Not icing or taking any medication.  Past Medical History  Diagnosis Date  . Depression   . History of chicken pox   . Adenomatous colon polyp     gets colonoscopies every 3 years.    . Dysplastic nevi   . History of telogen effluvium   . Vitamin D deficiency     Current Outpatient Prescriptions on File Prior to Visit  Medication Sig Dispense Refill  . BIOTIN FORTE PO Take 1 tablet by mouth daily.        . Calcium Carbonate-Vitamin D (CALCIUM 600+D) 600-400 MG-UNIT per tablet Take 1 tablet by mouth 2 (two) times daily.       . furosemide (LASIX) 20 MG tablet Take 1 tablet (20 mg total) by mouth daily as needed.  30 tablet  4  . Misc Natural Products (GLUCOSAMINE CHONDROITIN ADV PO) Take 2 tablets by mouth daily.        . Multiple Vitamin (MULTIVITAMIN) tablet Take 1 tablet by mouth daily.        Marland Kitchen venlafaxine XR (EFFEXOR-XR) 150 MG 24 hr capsule TAKE ONE CAPSULE EVERY DAY  30 capsule  2  . zolpidem (AMBIEN) 5 MG tablet Take 1 tablet (5 mg total) by mouth at bedtime as needed for sleep.  30 tablet  0   No current facility-administered medications on file prior to visit.    History reviewed. No pertinent past surgical history.  No Known Allergies  History   Social History  . Marital Status: Married    Spouse Name: N/A    Number of Children: 2  . Years of Education: N/A   Occupational History  . chemist    Social History Main Topics  . Smoking status: Never Smoker   . Smokeless tobacco: Never Used  . Alcohol Use: No  . Drug Use: No  . Sexually Active: Not on file   Other  Topics Concern  . Not on file   Social History Narrative   Caffeine use: none   Regular exercise:  No          Family History  Problem Relation Age of Onset  . Colon cancer Mother 43  . Sudden death Neg Hx   . Hypertension Neg Hx   . Hyperlipidemia Neg Hx   . Heart attack Neg Hx   . Diabetes Neg Hx   . Colon polyps Maternal Grandmother     BP 120/76  Pulse 92  Ht 5\' 5"  (1.651 m)  Wt 138 lb (62.596 kg)  BMI 22.96 kg/m2  Review of Systems See HPI above.    Objective:   Physical Exam Gen: NAD  L elbow: No gross deformity, swelling, bruising. TTP triceps tendon.  No olecranon, epicondyle, other TTP about elbow. FROM. Strength 5/5 with flexion and extension - pain with extension. Collateral ligaments intact. Negative tinels cubital tunnel. NVI distally.  MSK u/s: Triceps tendon intact without partial or full thickness tearing.  Has increased neovascularity focally near insertion of triceps within the tendon.    Assessment & Plan:  1. Left elbow pain -  2/2 traumatic triceps tendinopathy/strain.  Reassured.  Should improve over the next couple weeks.  Icing, nsaids as needed.  Discussed basic home strengthening program.  Consider formal PT if not improving as expected.

## 2012-06-08 NOTE — Assessment & Plan Note (Signed)
2/2 traumatic triceps tendinopathy/strain.  Reassured.  Should improve over the next couple weeks.  Icing, nsaids as needed.  Discussed basic home strengthening program.  Consider formal PT if not improving as expected.

## 2012-06-08 NOTE — Patient Instructions (Addendum)
Verbal: discussed icing, nsaids, home strengthening. Follow up as needed.

## 2012-06-19 ENCOUNTER — Encounter: Payer: BC Managed Care – PPO | Admitting: Rehabilitation

## 2012-07-13 HISTORY — PX: HIP SURGERY: SHX245

## 2012-07-17 ENCOUNTER — Ambulatory Visit: Payer: BC Managed Care – PPO | Admitting: Rehabilitation

## 2012-07-18 ENCOUNTER — Ambulatory Visit: Payer: BC Managed Care – PPO | Admitting: Rehabilitation

## 2012-07-19 ENCOUNTER — Ambulatory Visit: Payer: BC Managed Care – PPO | Admitting: Rehabilitation

## 2012-07-20 ENCOUNTER — Ambulatory Visit: Payer: BC Managed Care – PPO | Admitting: Rehabilitation

## 2012-07-24 ENCOUNTER — Ambulatory Visit: Payer: BC Managed Care – PPO | Admitting: Rehabilitation

## 2012-07-25 ENCOUNTER — Ambulatory Visit: Payer: BC Managed Care – PPO | Admitting: Rehabilitation

## 2012-07-26 ENCOUNTER — Ambulatory Visit: Payer: BC Managed Care – PPO | Admitting: Rehabilitation

## 2012-07-27 ENCOUNTER — Ambulatory Visit: Payer: BC Managed Care – PPO | Admitting: Rehabilitation

## 2012-07-28 ENCOUNTER — Ambulatory Visit: Payer: BC Managed Care – PPO | Admitting: Rehabilitation

## 2012-07-31 ENCOUNTER — Ambulatory Visit: Payer: BC Managed Care – PPO | Admitting: Rehabilitation

## 2012-08-01 ENCOUNTER — Ambulatory Visit: Payer: BC Managed Care – PPO | Admitting: Rehabilitation

## 2012-08-02 ENCOUNTER — Ambulatory Visit: Payer: BC Managed Care – PPO | Admitting: Rehabilitation

## 2012-08-03 ENCOUNTER — Ambulatory Visit: Payer: BC Managed Care – PPO | Admitting: Rehabilitation

## 2012-08-04 ENCOUNTER — Ambulatory Visit: Payer: BC Managed Care – PPO | Admitting: Rehabilitation

## 2012-08-07 ENCOUNTER — Ambulatory Visit: Payer: BC Managed Care – PPO | Admitting: Rehabilitation

## 2012-08-07 ENCOUNTER — Encounter: Payer: BC Managed Care – PPO | Admitting: Rehabilitation

## 2012-08-09 ENCOUNTER — Ambulatory Visit: Payer: BC Managed Care – PPO | Admitting: Rehabilitation

## 2012-08-11 ENCOUNTER — Ambulatory Visit: Payer: BC Managed Care – PPO | Admitting: Rehabilitation

## 2012-08-14 ENCOUNTER — Ambulatory Visit: Payer: BC Managed Care – PPO | Admitting: Rehabilitation

## 2012-08-16 ENCOUNTER — Ambulatory Visit: Payer: BC Managed Care – PPO | Admitting: Rehabilitation

## 2012-08-18 ENCOUNTER — Ambulatory Visit: Payer: BC Managed Care – PPO | Admitting: Rehabilitation

## 2012-08-26 ENCOUNTER — Other Ambulatory Visit: Payer: Self-pay | Admitting: Family

## 2012-08-30 ENCOUNTER — Encounter: Payer: Self-pay | Admitting: Family

## 2012-08-30 ENCOUNTER — Ambulatory Visit (INDEPENDENT_AMBULATORY_CARE_PROVIDER_SITE_OTHER): Payer: BC Managed Care – PPO | Admitting: Family

## 2012-08-30 VITALS — BP 100/70 | HR 94 | Temp 98.7°F | Resp 14 | Ht 65.0 in | Wt 140.0 lb

## 2012-08-30 DIAGNOSIS — R4789 Other speech disturbances: Secondary | ICD-10-CM | POA: Insufficient documentation

## 2012-08-30 DIAGNOSIS — F341 Dysthymic disorder: Secondary | ICD-10-CM

## 2012-08-30 DIAGNOSIS — G4700 Insomnia, unspecified: Secondary | ICD-10-CM | POA: Insufficient documentation

## 2012-08-30 DIAGNOSIS — F419 Anxiety disorder, unspecified: Secondary | ICD-10-CM

## 2012-08-30 DIAGNOSIS — M25559 Pain in unspecified hip: Secondary | ICD-10-CM

## 2012-08-30 DIAGNOSIS — M25551 Pain in right hip: Secondary | ICD-10-CM

## 2012-08-30 DIAGNOSIS — F809 Developmental disorder of speech and language, unspecified: Secondary | ICD-10-CM

## 2012-08-30 DIAGNOSIS — F32A Depression, unspecified: Secondary | ICD-10-CM

## 2012-08-30 NOTE — Assessment & Plan Note (Signed)
S/p right hip arthroscopy, feeling better- managed by ortho at [facility].

## 2012-08-30 NOTE — Assessment & Plan Note (Signed)
Recommended trial of melatonin.

## 2012-08-30 NOTE — Patient Instructions (Addendum)
You can try melatonin 5 mg by mouth 1 hour before bed. You will be contacted about your referral to neurology.  Please let us know if you have not heard back within 1 week about your referral. Please schedule a follow up appointment in 3 months.

## 2012-08-30 NOTE — Assessment & Plan Note (Signed)
Deteriorating.  In the past her work up has included rpr, b12, folate, tsh which were all normal.  As symptoms are worsening, will refer to neurology for further evaluation.

## 2012-08-30 NOTE — Progress Notes (Signed)
Subjective:    Patient ID: Marie Ortega, female    DOB: [birth year], 42 y.o.   MRN: [id number]  HPI  Ms. Schubring is a 42 yr old female who presents today for follow up of her depression.  1) Depression- she is currently maintained on effexor. She does not feel that it is working as well as it had been in the past. She reports that she is having trouble sleeping.  She also reports some difficulty with word retrieval.  Reports that she still feels like she "says things that are crazy."  For example she tells me she will say things to her kids like," Put the milk in the washing machine."   2) Insomnia- reports that she does not have trouble falling asleep, but often wakes up in the night.  Often wakes up feeling hot, though she denies hot flashes during the day.  Has tried [place] but this does not help her stay asleep.  She has not tried melatonin in the past.  Denies significant caffeine use.    3) S/p hip arthroscopy 6/26- feeling better.    Review of Systems See HPI  Past Medical History  Diagnosis Date  . Depression   . History of chicken pox   . Adenomatous colon polyp     gets colonoscopies every 3 years.    . Dysplastic nevi   . History of telogen effluvium   . Vitamin D deficiency     History   Social History  . Marital Status: Married    Spouse Name: N/A    Number of Children: 2  . Years of Education: N/A   Occupational History  . chemist    Social History Main Topics  . Smoking status: Never Smoker   . Smokeless tobacco: Never Used  . Alcohol Use: No  . Drug Use: No  . Sexual Activity: Not on file   Other Topics Concern  . Not on file   Social History Narrative   Caffeine use: none   Regular exercise:  No          Past Surgical History  Procedure Laterality Date  . Hip surgery Right 07/13/12    Dr [facility]    Family History  Problem Relation Age of Onset  . Colon cancer Mother 41  . Sudden death Neg Hx   .  Hypertension Neg Hx   . Hyperlipidemia Neg Hx   . Heart attack Neg Hx   . Diabetes Neg Hx   . Colon polyps Maternal Grandmother     No Known Allergies  Current Outpatient Prescriptions on File Prior to Visit  Medication Sig Dispense Refill  . BIOTIN FORTE PO Take 1 tablet by mouth daily.        . Calcium Carbonate-Vitamin D (CALCIUM 600+D) 600-400 MG-UNIT per tablet Take 1 tablet by mouth 2 (two) times daily.       . furosemide (LASIX) 20 MG tablet Take 1 tablet (20 mg total) by mouth daily as needed.  30 tablet  4  . Misc Natural Products (GLUCOSAMINE CHONDROITIN ADV PO) Take 2 tablets by mouth daily.        . Multiple Vitamin (MULTIVITAMIN) tablet Take 1 tablet by mouth daily.        Marland Kitchen venlafaxine XR (EFFEXOR-XR) 150 MG 24 hr capsule TAKE ONE CAPSULE EVERY DAY  30 capsule  2  . zolpidem (AMBIEN) 5 MG tablet Take 1 tablet (5 mg total) by mouth at bedtime as needed  for sleep.  30 tablet  0   No current facility-administered medications on file prior to visit.    BP 100/70  Pulse 94  Temp(Src) 98.7 F (37.1 C) (Oral)  Resp 14  Ht 5\' 5"  (1.651 m)  Wt 140 lb (63.504 kg)  BMI 23.3 kg/m2  SpO2 98%       Objective:   Physical Exam  Constitutional: She appears well-developed and well-nourished.  Neurological:  Normal speech, no difficulty word finding during interview.  Psychiatric: She has a normal mood and affect. Her behavior is normal. Judgment and thought content normal.          Assessment & Plan:

## 2012-08-30 NOTE — Assessment & Plan Note (Signed)
Seems stable on effexor.  Will continue same.

## 2012-09-22 ENCOUNTER — Ambulatory Visit (INDEPENDENT_AMBULATORY_CARE_PROVIDER_SITE_OTHER): Payer: BC Managed Care – PPO | Admitting: Neurology

## 2012-09-22 ENCOUNTER — Telehealth: Payer: Self-pay | Admitting: Neurology

## 2012-09-22 ENCOUNTER — Encounter: Payer: Self-pay | Admitting: Neurology

## 2012-09-22 ENCOUNTER — Ambulatory Visit: Payer: BC Managed Care – PPO | Admitting: Family

## 2012-09-22 VITALS — BP 90/64 | HR 76 | Temp 98.1°F | Ht 65.0 in | Wt 138.0 lb

## 2012-09-22 DIAGNOSIS — R4189 Other symptoms and signs involving cognitive functions and awareness: Secondary | ICD-10-CM

## 2012-09-22 DIAGNOSIS — F809 Developmental disorder of speech and language, unspecified: Secondary | ICD-10-CM

## 2012-09-22 DIAGNOSIS — R4789 Other speech disturbances: Secondary | ICD-10-CM

## 2012-09-22 NOTE — Patient Instructions (Addendum)
To evaluate further, we will get an MRI of the brain, as well as a  B12 level.  We will follow up in 6 months.  Your MRI is scheduled at [facility] on Tuesday, September 9th at 11:00am. Please check in at the first floor radiology department 15 minutes prior to your scheduled appointment time. Please enter the hospital off of [address] at [employer].     [phone].

## 2012-09-22 NOTE — Telephone Encounter (Signed)
Message copied by Benay Spice on Fri Sep 22, 2012  4:44 PM ------      Message from: JAFFE, ADAM R      Created: Fri Sep 22, 2012  1:34 PM         Please let Ms. Postel know that her B12 level is normal.      AJ      ----- Message -----         From: [facility] Interface         Sent: 09/22/2012   1:26 PM           To: Cira Servant, DO                   ------

## 2012-09-22 NOTE — Progress Notes (Signed)
NEUROLOGY CONSULTATION NOTE  Marie Ortega MRN: [id number] DOB: [birth year]  Referring provider: Sandford Craze, NP Primary care provider: Sandford Craze, NP  Reason for consult:  Language problems.  HISTORY OF PRESENT ILLNESS: Marie Ortega is a 42 year old woman with anxiety and depression who presents for word-finding difficulties.  Records and images were personally reviewed where available.   She first noticed symptoms about 2 years ago.  Soon after that time, she moved to the area and got a new job. She notes that she sometimes has a little difficulty finding what were she wants to use. However, she will usually use the wrong word in a sentence. For example, she has told her children to "put the dirty dishes in the washing machine. "Another time, while her daughter was coloring, she asked for a blue crayon when she meds a green crayon. Another example is using pantry instead of refrigerator. She doesn't use any words that are completely unrelated or not within contacts. She has no difficulty understanding when other people talk with sometimes would need a moment to process what they're saying. She does not make any mistakes while reading or writing. She sometimes forgets the names of people that she knows, but not close relatives. She works in an [employer] and has not had any trouble writing her notes. When she first got the job, he took a little while for her to learn the process but she now has no trouble.  She is currently being treated with Effexor, which she does not feel it is working as well as in the past.  She reports difficulty falling asleep and will also wake up often during the night.  She often feels hot when she wakes up in the middle of the night.  She has tried Ambien, which does not help her stay asleep. Overall, she doesn't feel that her anxiety and stress is any worsening was before. As a matter of fact, she was out of work this past summer do to hip  problems and still continued to make these mistakes. As far as family history, her grandmother seems to have dementia.  Recent labs: TSH (03/13/12) 1.155.  PAST MEDICAL HISTORY: Past Medical History  Diagnosis Date  . Depression   . History of chicken pox   . Adenomatous colon polyp     gets colonoscopies every 3 years.    . Dysplastic nevi   . History of telogen effluvium   . Vitamin D deficiency     PAST SURGICAL HISTORY: Past Surgical History  Procedure Laterality Date  . Hip surgery Right 07/13/12    Dr [facility]    MEDICATIONS: Current Outpatient Prescriptions on File Prior to Visit  Medication Sig Dispense Refill  . BIOTIN FORTE PO Take 1 tablet by mouth daily.        . Calcium Carbonate-Vitamin D (CALCIUM 600+D) 600-400 MG-UNIT per tablet Take 1 tablet by mouth 2 (two) times daily.       . furosemide (LASIX) 20 MG tablet Take 1 tablet (20 mg total) by mouth daily as needed.  30 tablet  4  . Misc Natural Products (GLUCOSAMINE CHONDROITIN ADV PO) Take 2 tablets by mouth daily.        . Multiple Vitamin (MULTIVITAMIN) tablet Take 1 tablet by mouth daily.        Marland Kitchen venlafaxine XR (EFFEXOR-XR) 150 MG 24 hr capsule TAKE ONE CAPSULE EVERY DAY  30 capsule  2  . Famotidine (PEPCID AC  PO) Take 1 tablet by mouth daily.      . naproxen (NAPROSYN) 500 MG tablet Take 1 tablet by mouth daily.      Marland Kitchen zolpidem (AMBIEN) 5 MG tablet Take 1 tablet (5 mg total) by mouth at bedtime as needed for sleep.  30 tablet  0   No current facility-administered medications on file prior to visit.    ALLERGIES: No Known Allergies  FAMILY HISTORY: Family History  Problem Relation Age of Onset  . Colon cancer Mother 66  . Sudden death Neg Hx   . Hypertension Neg Hx   . Hyperlipidemia Neg Hx   . Heart attack Neg Hx   . Diabetes Neg Hx   . Colon polyps Maternal Grandmother     SOCIAL HISTORY: History   Social History  . Marital Status: Married    Spouse Name: N/A     Number of Children: 2  . Years of Education: N/A   Occupational History  . chemist    Social History Main Topics  . Smoking status: Never Smoker   . Smokeless tobacco: Never Used  . Alcohol Use: No  . Drug Use: No  . Sexual Activity: Not on file   Other Topics Concern  . Not on file   Social History Narrative   Caffeine use: none   Regular exercise:  No          REVIEW OF SYSTEMS: Constitutional: No fevers, chills, or sweats, no generalized fatigue, change in appetite Eyes: No visual changes, double vision, eye pain Ear, nose and throat: No hearing loss, ear pain, nasal congestion, sore throat Cardiovascular: No chest pain, palpitations Respiratory:  No shortness of breath at rest or with exertion, wheezes GastrointestinaI: No nausea, vomiting, diarrhea, abdominal pain, fecal incontinence Genitourinary:  No dysuria, urinary retention or frequency Musculoskeletal:  No neck pain, back pain Integumentary: No rash, pruritus, skin lesions Neurological: as above Psychiatric: No depression, insomnia, anxiety Endocrine: No palpitations, fatigue, diaphoresis, mood swings, change in appetite, change in weight, increased thirst Hematologic/Lymphatic:  No anemia, purpura, petechiae. Allergic/Immunologic: no itchy/runny eyes, nasal congestion, recent allergic reactions, rashes  PHYSICAL EXAM: Filed Vitals:   09/22/12 0945  BP: 90/64  Pulse: 76  Temp: 98.1 F (36.7 C)   General: No acute distress Head:  Normocephalic/atraumatic Neck: supple, no paraspinal tenderness, full range of motion Back: No paraspinal tenderness Heart: regular rate and rhythm Lungs: Clear to auscultation bilaterally. Vascular: No carotid bruits. Neurological Exam: Mental status: alert and oriented to person, place, and time, speech fluent and not dysarthric, visuo-spatial and executive functioning intact, able to perform serial 7 subtraction, naming and repeating intact, reduced fluency when naming  words beginning with F, recalled 4 out of 5 words, MOCA 28/30 Cranial nerves: CN I: not tested CN II: pupils equal, round and reactive to light, visual fields intact, fundi unremarkable. CN III, IV, VI:  full range of motion, no nystagmus, no ptosis CN V: facial sensation intact CN VII: upper and lower face symmetric CN VIII: hearing intact CN IX, X: gag intact, uvula midline CN XI: sternocleidomastoid and trapezius muscles intact CN XII: tongue midline Bulk & Tone: normal, no fasciculations. Motor: 5/5 throughout Sensation: Temperature and vibration intact Deep Tendon Reflexes: 3+ throughout, toes down Finger to nose testing: Normal Gait: Stance and stride normal, able to walk in tandem. Romberg negative.  IMPRESSION & PLAN: Language issues described as paraphasic errors.  Scored well on MOCA.  Given that it only involves speech and not writing  or reading, an organic source is less likely but should be ruled out. 1.  MRI of brain to look for lesion in language center 2.  Check B12 level 3.  Follow up in 6 months or as needed.  Will call with results and further plan.  45 minute spent with patient, over 50% spent counseling and coordinating care.   Thank you for allowing me to take part in the care of this patient.  Shon Millet, DO  CC:  Sandford Craze, NP

## 2012-09-22 NOTE — Telephone Encounter (Signed)
Left vm message that B12 was normal.

## 2012-09-26 ENCOUNTER — Telehealth: Payer: Self-pay | Admitting: Neurology

## 2012-09-26 ENCOUNTER — Ambulatory Visit (HOSPITAL_COMMUNITY)
Admission: RE | Admit: 2012-09-26 | Discharge: 2012-09-26 | Disposition: A | Discharge status: Home / Self Care | Payer: BC Managed Care – PPO | Source: Ambulatory Visit | Attending: Neurology | Admitting: Neurology

## 2012-09-26 DIAGNOSIS — R4189 Other symptoms and signs involving cognitive functions and awareness: Secondary | ICD-10-CM

## 2012-09-26 DIAGNOSIS — R41841 Cognitive communication deficit: Secondary | ICD-10-CM | POA: Insufficient documentation

## 2012-09-26 NOTE — Telephone Encounter (Signed)
Message copied by Benay Spice on Tue Sep 26, 2012  4:39 PM ------      Message from: JAFFE, ADAM R      Created: Tue Sep 26, 2012  3:06 PM       MRI of brain is normal.  At this point, I don't think there is any neurological abnormality to explain the word-finding.  I think we should just monitor symptoms for now.            ----- Message -----         From: Rad Results In Interface         Sent: 09/26/2012   2:57 PM           To: Cira Servant, DO                   ------

## 2012-09-26 NOTE — Telephone Encounter (Signed)
Left a detailed message re: MRI results as directed by Dr. Everlena Cooper. Asked that she call the office if questions.

## 2012-10-03 ENCOUNTER — Encounter: Payer: Self-pay | Admitting: Gastroenterology

## 2012-10-03 ENCOUNTER — Ambulatory Visit (INDEPENDENT_AMBULATORY_CARE_PROVIDER_SITE_OTHER): Payer: BC Managed Care – PPO | Admitting: Gastroenterology

## 2012-10-03 VITALS — BP 106/70 | HR 96 | Ht 65.0 in | Wt 138.5 lb

## 2012-10-03 DIAGNOSIS — K5900 Constipation, unspecified: Secondary | ICD-10-CM

## 2012-10-03 DIAGNOSIS — Z86010 Personal history of colonic polyps: Secondary | ICD-10-CM

## 2012-10-03 NOTE — Patient Instructions (Signed)
Please use Miralax mix ing 17 grams in 8 oz of water 1-3 x daily to titrate depending on bowel movements.   Thank you for choosing me and [facility].  Venita Lick. Pleas Koch., MD., Clementeen Graham

## 2012-10-03 NOTE — Progress Notes (Signed)
History of Present Illness: This is a 42 year old female who returns for constipation. She states she is not using laxatives. She was previously recommended to use MiraLax on a regular basis. States she has a bowel movement about every 3-4 days and occasionally she has significant lower abdominal pain bowel movements. The pain has been associated with sweats on 1 or 2 occasions. She underwent colonoscopy in January. Denies weight loss, diarrhea, change in stool caliber, melena, hematochezia, nausea, vomiting, dysphagia, reflux symptoms, chest pain.  Current Medications, Allergies, Past Medical History, Past Surgical History, Family History and Social History were reviewed in [employer] record.  Physical Exam: General: Well developed , well nourished, no acute distress Head: Normocephalic and atraumatic Eyes:  sclerae anicteric, EOMI Ears: Normal auditory acuity Mouth: No deformity or lesions Lungs: Clear throughout to auscultation Heart: Regular rate and rhythm; no murmurs, rubs or bruits Abdomen: Soft, non tender and non distended. No masses, hepatosplenomegaly or hernias noted. Normal Bowel sounds Musculoskeletal: Symmetrical with no gross deformities  Pulses:  Normal pulses noted Extremities: No clubbing, cyanosis, edema or deformities noted Neurological: Alert oriented x 4, grossly nonfocal Psychological:  Alert and cooperative. Normal mood and affect  Assessment and Recommendations:  1. Constipation. Miralax 1-3 times daily titrated for adequate bowel movements once a long-term plan for management of constipation. We had a long discussion about importance of adequate long-term management of constipation. She can try a daily probiotic. She was recommended to increase her fiber and water intake as she can tolerate.  2. Personal history of adenomatous colon polyps and family history of colon cancer. Surveillance colonoscopy due in 01/2015.

## 2012-11-28 ENCOUNTER — Other Ambulatory Visit: Payer: Self-pay | Admitting: Family

## 2012-12-08 ENCOUNTER — Ambulatory Visit: Payer: BC Managed Care – PPO | Admitting: Family

## 2013-01-24 ENCOUNTER — Other Ambulatory Visit: Payer: Self-pay | Admitting: Family

## 2013-01-24 DIAGNOSIS — Z1231 Encounter for screening mammogram for malignant neoplasm of breast: Secondary | ICD-10-CM

## 2013-01-29 ENCOUNTER — Ambulatory Visit (HOSPITAL_BASED_OUTPATIENT_CLINIC_OR_DEPARTMENT_OTHER)
Admission: RE | Admit: 2013-01-29 | Discharge: 2013-01-29 | Disposition: A | Discharge status: Home / Self Care | Payer: BC Managed Care – PPO | Source: Ambulatory Visit | Attending: Family | Admitting: Family

## 2013-01-29 DIAGNOSIS — Z1231 Encounter for screening mammogram for malignant neoplasm of breast: Secondary | ICD-10-CM | POA: Insufficient documentation

## 2013-02-16 ENCOUNTER — Encounter: Payer: Self-pay | Admitting: Physician Assistant

## 2013-02-16 ENCOUNTER — Ambulatory Visit (INDEPENDENT_AMBULATORY_CARE_PROVIDER_SITE_OTHER): Payer: BC Managed Care – PPO | Admitting: Physician Assistant

## 2013-02-16 VITALS — BP 110/68 | HR 92 | Temp 98.1°F | Resp 16 | Ht 65.0 in | Wt 139.8 lb

## 2013-02-16 DIAGNOSIS — H6690 Otitis media, unspecified, unspecified ear: Secondary | ICD-10-CM

## 2013-02-16 DIAGNOSIS — J329 Chronic sinusitis, unspecified: Secondary | ICD-10-CM

## 2013-02-16 DIAGNOSIS — H6692 Otitis media, unspecified, left ear: Secondary | ICD-10-CM

## 2013-02-16 MED ORDER — FLUTICASONE PROPIONATE 50 MCG/ACT NA SUSP: 2.0000 | Freq: Every day | NASAL | Status: DC

## 2013-02-16 MED ORDER — AMOXICILLIN-POT CLAVULANATE 875-125 MG PO TABS: 1.0000 | ORAL_TABLET | Freq: Two times a day (BID) | ORAL | Status: DC

## 2013-02-16 NOTE — Patient Instructions (Signed)
Please take medications as prescribed.  Increase fluid intake.  Continue claritin.  Take mucinex as needed.  Place a humidifier in the bedroom.  Please call if symptoms are not improving.  Sinusitis Sinusitis is redness, soreness, and swelling (inflammation) of the paranasal sinuses. Paranasal sinuses are air pockets within the bones of your face (beneath the eyes, the middle of the forehead, or above the eyes). In healthy paranasal sinuses, mucus is able to drain out, and air is able to circulate through them by way of your nose. However, when your paranasal sinuses are inflamed, mucus and air can become trapped. This can allow bacteria and other germs to grow and cause infection. Sinusitis can develop quickly and last only a short time (acute) or continue over a long period (chronic). Sinusitis that lasts for more than 12 weeks is considered chronic.  CAUSES  Causes of sinusitis include:  Allergies.  Structural abnormalities, such as displacement of the cartilage that separates your nostrils (deviated septum), which can decrease the air flow through your nose and sinuses and affect sinus drainage.  Functional abnormalities, such as when the small hairs (cilia) that line your sinuses and help remove mucus do not work properly or are not present. SYMPTOMS  Symptoms of acute and chronic sinusitis are the same. The primary symptoms are pain and pressure around the affected sinuses. Other symptoms include:  Upper toothache.  Earache.  Headache.  Bad breath.  Decreased sense of smell and taste.  A cough, which worsens when you are lying flat.  Fatigue.  Fever.  Thick drainage from your nose, which often is green and may contain pus (purulent).  Swelling and warmth over the affected sinuses. DIAGNOSIS  Your caregiver will perform a physical exam. During the exam, your caregiver may:  Look in your nose for signs of abnormal growths in your nostrils (nasal polyps).  Tap over the  affected sinus to check for signs of infection.  View the inside of your sinuses (endoscopy) with a special imaging device with a light attached (endoscope), which is inserted into your sinuses. If your caregiver suspects that you have chronic sinusitis, one or more of the following tests may be recommended:  Allergy tests.  Nasal culture A sample of mucus is taken from your nose and sent to a lab and screened for bacteria.  Nasal cytology A sample of mucus is taken from your nose and examined by your caregiver to determine if your sinusitis is related to an allergy. TREATMENT  Most cases of acute sinusitis are related to a viral infection and will resolve on their own within 10 days. Sometimes medicines are prescribed to help relieve symptoms (pain medicine, decongestants, nasal steroid sprays, or saline sprays).  However, for sinusitis related to a bacterial infection, your caregiver will prescribe antibiotic medicines. These are medicines that will help kill the bacteria causing the infection.  Rarely, sinusitis is caused by a fungal infection. In theses cases, your caregiver will prescribe antifungal medicine. For some cases of chronic sinusitis, surgery is needed. Generally, these are cases in which sinusitis recurs more than 3 times per year, despite other treatments. HOME CARE INSTRUCTIONS   Drink plenty of water. Water helps thin the mucus so your sinuses can drain more easily.  Use a humidifier.  Inhale steam 3 to 4 times a day (for example, sit in the bathroom with the shower running).  Apply a warm, moist washcloth to your face 3 to 4 times a day, or as directed by your  caregiver.  Use saline nasal sprays to help moisten and clean your sinuses.  Take over-the-counter or prescription medicines for pain, discomfort, or fever only as directed by your caregiver. SEEK IMMEDIATE MEDICAL CARE IF:  You have increasing pain or severe headaches.  You have nausea, vomiting, or  drowsiness.  You have swelling around your face.  You have vision problems.  You have a stiff neck.  You have difficulty breathing. MAKE SURE YOU:   Understand these instructions.  Will watch your condition.  Will get help right away if you are not doing well or get worse. Document Released: 01/04/2005 Document Revised: 03/29/2011 Document Reviewed: 01/19/2011 [facility] Patient Information 2014 [place], [place].

## 2013-02-16 NOTE — Assessment & Plan Note (Signed)
Rx Augmentin.  Rx Flonase.  Increase fluid intake.  Rest. Saline nasal spray.  Probiotic. Mucinex.  Place a humidifier in the bedroom.  Tylenol if needed for HA, throat or if fever develops.  Return to clinic if symptoms are not improving.

## 2013-02-16 NOTE — Progress Notes (Signed)
Patient presents to clinic today c/o sinus pressure and sinus pain that she experienced starting 6 days ago.  Patient states symptoms had improved some on their own, however around 2 days ago symptoms returned and have been worsening. Patient endorses headache, tooth pain, bilateral ear pain with R>L, subjective fevers, post-nasal drip and fatigue.  Patient denies cough.  Denies shortness of breath or wheezing.  Denies sick contact or recent travel.  Denies history of asthma or significant allergies.  Past Medical History  Diagnosis Date  . Depression   . History of chicken pox   . Adenomatous colon polyp 12/2008    gets colonoscopies every 3 years.    . Dysplastic nevi   . History of telogen effluvium   . Vitamin D deficiency     Current Outpatient Prescriptions on File Prior to Visit  Medication Sig Dispense Refill  . BIOTIN FORTE PO Take 1 tablet by mouth daily.        . Calcium Carbonate-Vitamin D (CALCIUM 600+D) 600-400 MG-UNIT per tablet Take 1 tablet by mouth 2 (two) times daily.       . furosemide (LASIX) 20 MG tablet Take 1 tablet (20 mg total) by mouth daily as needed.  30 tablet  4  . Melatonin 10 MG TABS Take by mouth at bedtime as needed.      . Misc Natural Products (GLUCOSAMINE CHONDROITIN ADV PO) Take 2 tablets by mouth daily.        . Multiple Vitamin (MULTIVITAMIN) tablet Take 1 tablet by mouth daily.        Marland Kitchen venlafaxine XR (EFFEXOR-XR) 150 MG 24 hr capsule TAKE ONE CAPSULE EVERY DAY  30 capsule  2   No current facility-administered medications on file prior to visit.    No Known Allergies  Family History  Problem Relation Age of Onset  . Colon cancer Mother 35  . Sudden death Neg Hx   . Hypertension Neg Hx   . Hyperlipidemia Neg Hx   . Heart attack Neg Hx   . Diabetes Neg Hx   . Colon polyps Maternal Grandmother     History   Social History  . Marital Status: Married    Spouse Name: N/A    Number of Children: 2  . Years of Education: N/A    Occupational History  . chemist    Social History Main Topics  . Smoking status: Never Smoker   . Smokeless tobacco: Never Used  . Alcohol Use: No  . Drug Use: No  . Sexual Activity: None   Other Topics Concern  . None   Social History Narrative   Caffeine use: none   Regular exercise:  No         Review of Systems - See HPI.  All other ROS are negative.  Filed Vitals:   02/16/13 1606  BP: 110/68  Pulse: 92  Temp: 98.1 F (36.7 C)  Resp: 16   Physical Exam  Vitals reviewed. Constitutional: She is oriented to person, place, and time and well-developed, well-nourished, and in no distress.  HENT:  Head: Normocephalic and atraumatic.  Right Ear: Tympanic membrane, external ear and ear canal normal.  Left Ear: External ear and ear canal normal. No drainage or swelling. Tympanic membrane is erythematous and bulging.  No middle ear effusion.  Nose: Nose normal.  Mouth/Throat: Uvula is midline, oropharynx is clear and moist and mucous membranes are normal. No oropharyngeal exudate, posterior oropharyngeal edema or posterior oropharyngeal erythema.  Eyes: Conjunctivae are  normal. Pupils are equal, round, and reactive to light.  Neck: Neck supple.  Cardiovascular: Normal rate, regular rhythm, normal heart sounds and intact distal pulses.   No murmur heard. Pulmonary/Chest: Effort normal and breath sounds normal. No respiratory distress. She has no wheezes. She has no rales. She exhibits no tenderness.  Lymphadenopathy:    She has no cervical adenopathy.  Neurological: She is alert and oriented to person, place, and time.  Skin: Skin is warm and dry. No rash noted.  Psychiatric: Affect normal.    No results found for this or any previous visit (from the past 2160 hour(s)).  Assessment/Plan: Sinusitis Rx Augmentin.  Rx Flonase.  Increase fluid intake.  Rest. Saline nasal spray.  Probiotic. Mucinex.  Place a humidifier in the bedroom.  Tylenol if needed for HA, throat or  if fever develops.  Return to clinic if symptoms are not improving.  Left otitis media Patient also with sinusitis.  Rx Augmentin.

## 2013-02-16 NOTE — Assessment & Plan Note (Signed)
Patient also with sinusitis.  Rx Augmentin.

## 2013-02-16 NOTE — Progress Notes (Signed)
Pre visit review using our clinic review tool, if applicable. No additional management support is needed unless otherwise documented below in the visit note/SLS  

## 2013-03-12 ENCOUNTER — Telehealth: Payer: Self-pay | Admitting: *Deleted

## 2013-03-12 NOTE — Telephone Encounter (Signed)
Received call from pt stating she has had increased difficulty with her home / work life balance. Feels she has little motivation to do anything, feels like she is in a slump. Takes melatonin at night but still wakes up between 2:30 and 3am.  Pt denies suicidal or homicidal ideation. Scheduled pt appt for tomorrow at 11am.

## 2013-03-12 NOTE — Telephone Encounter (Signed)
Noted  

## 2013-03-13 ENCOUNTER — Ambulatory Visit: Payer: BC Managed Care – PPO | Admitting: Family

## 2013-03-14 ENCOUNTER — Other Ambulatory Visit: Payer: Self-pay | Admitting: Family

## 2013-03-19 ENCOUNTER — Encounter: Payer: Self-pay | Admitting: Family

## 2013-03-19 ENCOUNTER — Ambulatory Visit (INDEPENDENT_AMBULATORY_CARE_PROVIDER_SITE_OTHER): Payer: BC Managed Care – PPO | Admitting: Family

## 2013-03-19 VITALS — BP 110/70 | HR 78 | Temp 98.6°F | Resp 16 | Ht 65.0 in | Wt 140.0 lb

## 2013-03-19 DIAGNOSIS — F329 Major depressive disorder, single episode, unspecified: Secondary | ICD-10-CM

## 2013-03-19 DIAGNOSIS — F341 Dysthymic disorder: Secondary | ICD-10-CM

## 2013-03-19 DIAGNOSIS — F32A Depression, unspecified: Secondary | ICD-10-CM

## 2013-03-19 DIAGNOSIS — F419 Anxiety disorder, unspecified: Principal | ICD-10-CM

## 2013-03-19 MED ORDER — VENLAFAXINE HCL ER 75 MG PO CP24: ORAL_CAPSULE | ORAL | Status: DC

## 2013-03-19 MED ORDER — VILAZODONE HCL 40 MG PO TABS: 40.0000 mg | ORAL_TABLET | Freq: Every day | ORAL | Status: DC

## 2013-03-19 NOTE — Progress Notes (Signed)
Pre visit review using our clinic review tool, if applicable. No additional management support is needed unless otherwise documented below in the visit note. 

## 2013-03-19 NOTE — Patient Instructions (Signed)
Please taper down on effexor to 75 mg daily for 2 weeks, then every other day for 1 week, then stop. Start viibryd starter pak.  Call if you develop worsening depression. Follow up in 1 month.

## 2013-03-19 NOTE — Progress Notes (Signed)
Subjective:    Patient ID: Marie Ortega, female    DOB: February 16, 1970, 43 y.o.   MRN: [id number]  HPI  Marie Ortega is a 43 yr old female who presents today to discuss depression. She is currently maintained on effexor 150.  Reports that she has been having trouble motivating, feels snappy towards her husband, stays in bed later on the weekend and often stays in PJ's all weekend- doesn't leave the house. Denies tearfulness. Denies SI/HI. Gained weight with SSRI class. She denies current issues with anxiety.   Review of Systems See HPI  Past Medical History  Diagnosis Date  . Depression   . History of chicken pox   . Adenomatous colon polyp 12/2008    gets colonoscopies every 3 years.    . Dysplastic nevi   . History of telogen effluvium   . Vitamin D deficiency     History   Social History  . Marital Status: Married    Spouse Name: N/A    Number of Children: 2  . Years of Education: N/A   Occupational History  . chemist    Social History Main Topics  . Smoking status: Never Smoker   . Smokeless tobacco: Never Used  . Alcohol Use: No  . Drug Use: No  . Sexual Activity: Not on file   Other Topics Concern  . Not on file   Social History Narrative   Caffeine use: none   Regular exercise:  No          Past Surgical History  Procedure Laterality Date  . Hip surgery Right 07/13/12    Dr Thea Silversmith [facility]    Family History  Problem Relation Age of Onset  . Colon cancer Mother 55  . Sudden death Neg Hx   . Hypertension Neg Hx   . Hyperlipidemia Neg Hx   . Heart attack Neg Hx   . Diabetes Neg Hx   . Colon polyps Maternal Grandmother     No Known Allergies  Current Outpatient Prescriptions on File Prior to Visit  Medication Sig Dispense Refill  . BIOTIN FORTE PO Take 1 tablet by mouth daily.        . Calcium Carbonate-Vitamin D (CALCIUM 600+D) 600-400 MG-UNIT per tablet Take 1 tablet by mouth 2 (two) times daily.       . fluticasone  (FLONASE) 50 MCG/ACT nasal spray Place 2 sprays into both nostrils daily.  16 g  6  . furosemide (LASIX) 20 MG tablet Take 1 tablet (20 mg total) by mouth daily as needed.  30 tablet  4  . loratadine (CLARITIN) 10 MG tablet Take 10 mg by mouth daily.      . Melatonin 10 MG TABS Take by mouth at bedtime as needed.      . Misc Natural Products (GLUCOSAMINE CHONDROITIN ADV PO) Take 2 tablets by mouth daily.        . Multiple Vitamin (MULTIVITAMIN) tablet Take 1 tablet by mouth daily.         No current facility-administered medications on file prior to visit.    BP 110/70  Pulse 78  Temp(Src) 98.6 F (37 C) (Oral)  Resp 16  Ht 5\' 5"  (1.651 m)  Wt 140 lb (63.504 kg)  BMI 23.30 kg/m2  SpO2 98%  LMP 02/20/2013       Objective:   Physical Exam  Constitutional: She is oriented to person, place, and time. She appears well-developed and well-nourished. No distress.  Neurological:  She is alert and oriented to person, place, and time.  Psychiatric: Her behavior is normal. Judgment and thought content normal.  Mildly flattened affect.           Assessment & Plan:

## 2013-03-20 NOTE — Assessment & Plan Note (Signed)
Will taper down on effexor (as outlined in AVS) and start pt on viibryd. She was given a viibryd starter pack today.  15 minutes spent with pt today. >50% of this time was spent counseling pt on depression.

## 2013-03-23 ENCOUNTER — Ambulatory Visit: Payer: BC Managed Care – PPO | Admitting: Neurology

## 2013-04-09 ENCOUNTER — Ambulatory Visit (INDEPENDENT_AMBULATORY_CARE_PROVIDER_SITE_OTHER): Payer: BC Managed Care – PPO | Admitting: Neurology

## 2013-04-09 ENCOUNTER — Encounter: Payer: Self-pay | Admitting: Neurology

## 2013-04-09 VITALS — BP 126/70 | HR 70 | Temp 98.0°F | Resp 18 | Wt 143.6 lb

## 2013-04-09 DIAGNOSIS — F809 Developmental disorder of speech and language, unspecified: Secondary | ICD-10-CM

## 2013-04-09 DIAGNOSIS — R413 Other amnesia: Secondary | ICD-10-CM

## 2013-04-09 DIAGNOSIS — F411 Generalized anxiety disorder: Secondary | ICD-10-CM

## 2013-04-09 DIAGNOSIS — F419 Anxiety disorder, unspecified: Secondary | ICD-10-CM

## 2013-04-09 DIAGNOSIS — R4789 Other speech disturbances: Secondary | ICD-10-CM

## 2013-04-09 NOTE — Progress Notes (Signed)
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  Records and images were personally reviewed where available.   UPDATE: 09/22/12 Labs:  B12 327 09/26/12 MRI BRAIN WO personally reviewed:  normal  No changes.  In February she felt like she lacked motivation to do things.  Her Effexor was switched to Viibryd.  She does not feel depressed.  She forgets errands easily.  She still makes paraphasic errors.  HISTORY: She first noticed symptoms about 2.5 years ago.  Soon after that time, she moved to the area and got a new job. She notes that she sometimes has a little difficulty finding what were she wants to use. However, she will usually use the wrong word in a sentence. For example, she has told her children to "put the dirty dishes in the washing machine." Another time, while her daughter was coloring, she asked for a blue crayon when she meds a green crayon. Another example is using pantry instead of refrigerator. She doesn't use any words that are completely unrelated or not within contacts. She has no difficulty understanding when other people talk with sometimes would need a moment to process what they're saying. She does not make any mistakes while reading or writing. She sometimes forgets the names of people that she knows, but not close relatives. She works in an [employer] and has not had any trouble writing her notes. When she first got the job, he took a little while for her to learn the process but she now has no trouble.  She is currently being treated with Effexor, which she does not feel it is working as well as in the past.  She reports difficulty falling asleep and will also wake up often during the night.  She often feels hot when she wakes up in the middle of the night.  She has tried Ambien, which does not help her  stay asleep. Overall, she doesn't feel that her anxiety and stress is any worsening was before. As a matter of fact, she was out of work this past summer do to hip problems and still continued to make these mistakes. As far as family history, her grandmother seems to have dementia.  02/20/12 Labs: TSH 1.155.  In September 2014, her MOCA score was 28/30 with intact language but with reduced naming fluency.  PAST MEDICAL HISTORY: Past Medical History  Diagnosis Date  . Depression   . History of chicken pox   . Adenomatous colon polyp 12/2008    gets colonoscopies every 3 years.    . Dysplastic nevi   . History of telogen effluvium   . Vitamin D deficiency     MEDICATIONS: Current Outpatient Prescriptions on File Prior to Visit  Medication Sig Dispense Refill  . BIOTIN FORTE PO Take 1 tablet by mouth daily.        . Calcium Carbonate-Vitamin D (CALCIUM 600+D) 600-400 MG-UNIT per tablet Take 1 tablet by mouth 2 (two) times daily.       . fluticasone (FLONASE) 50 MCG/ACT nasal spray Place 2 sprays into both nostrils daily.  16 g  6  . furosemide (LASIX) 20 MG tablet Take 1 tablet (20 mg total) by mouth daily as needed.  30 tablet  4  . loratadine (CLARITIN) 10 MG tablet Take 10 mg by mouth daily.      Marland Kitchen  Melatonin 10 MG TABS Take by mouth at bedtime as needed.      . Misc Natural Products (GLUCOSAMINE CHONDROITIN ADV PO) Take 2 tablets by mouth daily.        . Multiple Vitamin (MULTIVITAMIN) tablet Take 1 tablet by mouth daily.        Marland Kitchen venlafaxine XR (EFFEXOR-XR) 75 MG 24 hr capsule 1 tablet by mouth daily for 2 weeks then every other day for 1 week, then stop  21 capsule  0  . Vilazodone HCl (VIIBRYD) 40 MG TABS Take 1 tablet (40 mg total) by mouth daily.  30 tablet  0   No current facility-administered medications on file prior to visit.    ALLERGIES: No Known Allergies  FAMILY HISTORY: Family History  Problem Relation Age of Onset  . Colon cancer Mother 39  . Sudden death Neg Hx    . Hypertension Neg Hx   . Hyperlipidemia Neg Hx   . Heart attack Neg Hx   . Diabetes Neg Hx   . Colon polyps Maternal Grandmother     SOCIAL HISTORY: History   Social History  . Marital Status: Married    Spouse Name: N/A    Number of Children: 2  . Years of Education: N/A   Occupational History  . chemist    Social History Main Topics  . Smoking status: Never Smoker   . Smokeless tobacco: Never Used  . Alcohol Use: No  . Drug Use: No  . Sexual Activity: Not on file   Other Topics Concern  . Not on file   Social History Narrative   Caffeine use: none   Regular exercise:  No          REVIEW OF SYSTEMS: Constitutional: No fevers, chills, or sweats, no generalized fatigue, change in appetite Eyes: No visual changes, double vision, eye pain Ear, nose and throat: No hearing loss, ear pain, nasal congestion, sore throat Cardiovascular: No chest pain, palpitations Respiratory:  No shortness of breath at rest or with exertion, wheezes GastrointestinaI: No nausea, vomiting, diarrhea, abdominal pain, fecal incontinence Genitourinary:  No dysuria, urinary retention or frequency Musculoskeletal:  No neck pain, back pain Integumentary: No rash, pruritus, skin lesions Neurological: as above Psychiatric: No depression, insomnia, anxiety Endocrine: No palpitations, fatigue, diaphoresis, mood swings, change in appetite, change in weight, increased thirst Hematologic/Lymphatic:  No anemia, purpura, petechiae. Allergic/Immunologic: no itchy/runny eyes, nasal congestion, recent allergic reactions, rashes  PHYSICAL EXAM: Filed Vitals:   04/09/13 1303  BP: 126/70  Pulse: 70  Temp: 98 F (36.7 C)  Resp: 18   General: No acute distress Head:  Normocephalic/atraumatic Neck: supple, no paraspinal tenderness, full range of motion Heart:  Regular rate and rhythm Lungs:  Clear to auscultation bilaterally Back: No paraspinal tenderness Neurological Exam: alert and oriented to  person, place, and time. Attention span and concentration intact, recent and remote memory intact, fund of knowledge intact.  Speech fluent and not dysarthric, able to name, repeat and follow complex commands.  Had some mild difficulty with naming fluency.  Able to complete Trail making test, cube and clock correctly.  Serial 7 subtraction intact (missed one).  Abstraction and attention intact.  Delayed recall:  2 of 5 words after 5 minutes.  MOCA 26/30.  CN II-XII intact. Fundoscopic exam unremarkable without vessel changes, exudates, hemorrhages or papilledema.  Bulk and tone normal, muscle strength 5/5 throughout.  Sensation to light touch, temperature and vibration intact.  Deep tendon reflexes 2+ throughout, toes downgoing.  Finger to nose and heel to shin testing intact.  Gait normal, Romberg negative.  IMPRESSION: Memory changes Paraphasic errors  No clear organic impairment appreciated.  PLAN: Will refer for neuropsychological evaluation.  Follow up soon after.  30 minutes spent with patient, over 50% spent counseling, reviewing tests and coordinating care.  Metta Clines, DO  CC:  Debbrah Alar, NP

## 2013-04-09 NOTE — Patient Instructions (Signed)
Everything seems stable.  Since the MRI and blood tests were okay, we will refer you to Dr. Valentina Shaggy for a neuropsychological evaluation.  He will call you to set up an appointment.  When you get your appointment, call us back to schedule a follow up with me right afterwards to discuss the results and the next step.

## 2013-04-11 ENCOUNTER — Telehealth: Payer: Self-pay | Admitting: *Deleted

## 2013-04-11 NOTE — Telephone Encounter (Signed)
Pt left message wanting to know if extreme rage is a side effect of viibryd or coming off of the effexor. States she has become incredibly angry x 2 days over a trivial situation at work. Please advise.

## 2013-04-11 NOTE — Telephone Encounter (Signed)
Notified pt and she voices understanding. 

## 2013-04-11 NOTE — Telephone Encounter (Signed)
Probably a little of both- I would recommend that she continue viibryd if she is able to tolerate until her follow up appointment.  Hopefully, symptoms will subside.

## 2013-04-11 NOTE — Telephone Encounter (Signed)
Received call from pt stating she was experiencing nausea and dizziness while trying to wean off Effexor. Completed Effexor last week and is on her last week of Viibryd trial pack (40mg ). States this week she is experiencing episodes of dizziness while sitting and head feels spacey. These episodes feel different to pt than her previous symptoms. Pt wants to know if this is her body trying to adjust to being off of effexor or is this from the viibryd?

## 2013-04-17 NOTE — Telephone Encounter (Signed)
It can be a side effect of either. Is she completely off the Effexor ( I think so). What dose of the Viibryd is she on? Up to 40? If she tolerated the 20 she could drop back to that and come in to see [place] next week. Or we can switch back to effexor for now and bring her back in to discuss options. If she gets and violent or unstable she should go to [facility] long ER

## 2013-04-17 NOTE — Telephone Encounter (Signed)
Left message for pt to return my call.

## 2013-04-17 NOTE — Telephone Encounter (Signed)
Left detailed message on pt's voicemail re: below instructions and to check on status of symptoms at present.

## 2013-04-18 ENCOUNTER — Other Ambulatory Visit: Payer: Self-pay | Admitting: Physician Assistant

## 2013-04-18 NOTE — Telephone Encounter (Signed)
Called patient to Triage her "Extreme Rage" issues & symptoms; pt feels that she is having uncontrollable anger/rage over the most minute issues w/o feelings about consequences of action. Pt states that she is physically throwing items at people but when asked it she felt like and/or was having any feelings of hurting herself or others, she responded "Do I feel like literally chasing people down & literally choking the life out of them, then No". Pt went on to describe her emotions as "unstable; with feeling of dizziness, spaciness & empty white light w/being mentally drained after these episodes"; also, pt sates that she "had a hormone imbalance some time ago and had these symptoms, but not at this intense level". Pt has not taken Viibryd today, instructed that she can take [1] Effexor 75 mg that she still has [wean off for Viibryd] today & also tomorrow morning, if needed, as she May get some relief since she has had a depressant in her system. Pt instructed that if she cannot control her emotions before appt tomorrow, she needs to get A&E at emergent facility; pt understood & agreed, stating she "felt she would be Ok until appt". Provider informed/SLS

## 2013-04-18 NOTE — Telephone Encounter (Signed)
Is up to 40mg , states she can not take this meds... Itchy skin and dizziness. Pt is requesting call back asap. Does have appt with Einar Pheasant on tomorrow

## 2013-04-19 ENCOUNTER — Ambulatory Visit (INDEPENDENT_AMBULATORY_CARE_PROVIDER_SITE_OTHER): Payer: BC Managed Care – PPO | Admitting: Physician Assistant

## 2013-04-19 ENCOUNTER — Encounter: Payer: Self-pay | Admitting: Physician Assistant

## 2013-04-19 VITALS — BP 106/78 | HR 75 | Temp 98.2°F | Resp 16 | Ht 65.0 in | Wt 142.0 lb

## 2013-04-19 DIAGNOSIS — F329 Major depressive disorder, single episode, unspecified: Secondary | ICD-10-CM

## 2013-04-19 DIAGNOSIS — F411 Generalized anxiety disorder: Secondary | ICD-10-CM

## 2013-04-19 DIAGNOSIS — F419 Anxiety disorder, unspecified: Secondary | ICD-10-CM

## 2013-04-19 DIAGNOSIS — F341 Dysthymic disorder: Secondary | ICD-10-CM

## 2013-04-19 DIAGNOSIS — F32A Depression, unspecified: Secondary | ICD-10-CM

## 2013-04-19 MED ORDER — SERTRALINE HCL 50 MG PO TABS: ORAL_TABLET | ORAL | Status: DC

## 2013-04-19 NOTE — Progress Notes (Signed)
Pre visit review using our clinic review tool, if applicable. No additional management support is needed unless otherwise documented below in the visit note/SLS  
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
## 2013-04-19 NOTE — Patient Instructions (Signed)
Please start the sertraline 25 mg (1/2 tablet) daily for 1 week.  Then increase to 50 mg (1 tablet) daily.  You will be contacted by psychiatry for an appointment.  Please follow-up with Neuropsychiatry as scheduled.  Please return to clinic in 1 month for follow-up.  Return sooner if you need anything.

## 2013-04-22 NOTE — Assessment & Plan Note (Addendum)
Mood Disorder assessment performed and negative. STOP Vilazodone.  Will start titrating up with Sertraline. Patient with multiple medication failures and complex symptomology.  Will refer to Psychiatry for further evaluation and medication management. Patient encouraged to keep upcoming appointment with Neuropsychiatry.  Follow-up with PCP in 1 month.  I spent 30 minutes with patient including time for interview, examination, mood disorder assessment, and discussing treatment options.

## 2013-04-23 ENCOUNTER — Ambulatory Visit: Payer: BC Managed Care – PPO | Admitting: Family

## 2013-05-06 ENCOUNTER — Emergency Department
Admission: EM | Admit: 2013-05-06 | Discharge: 2013-05-06 | Disposition: A | Discharge status: Home / Self Care | Payer: BC Managed Care – PPO | Source: Home / Self Care | Attending: Family Medicine | Admitting: Family Medicine

## 2013-05-06 ENCOUNTER — Encounter: Payer: Self-pay | Admitting: Emergency Medicine

## 2013-05-06 DIAGNOSIS — J302 Other seasonal allergic rhinitis: Secondary | ICD-10-CM

## 2013-05-06 DIAGNOSIS — J309 Allergic rhinitis, unspecified: Secondary | ICD-10-CM

## 2013-05-06 DIAGNOSIS — H6980 Other specified disorders of Eustachian tube, unspecified ear: Secondary | ICD-10-CM

## 2013-05-06 MED ORDER — PREDNISONE 20 MG PO TABS: 20.0000 mg | ORAL_TABLET | Freq: Two times a day (BID) | ORAL | Status: DC

## 2013-05-06 NOTE — ED Notes (Signed)
Patient c/o right ear pain and fluid in ear x 2 wks; sinus pressure has been taking OTC Claritin and Flonase with relief.

## 2013-05-06 NOTE — ED Provider Notes (Signed)
CSN: [id number]     Arrival date & time 05/06/13  1647 History   First MD Initiated Contact with Patient 05/06/13 1737     Chief Complaint  Patient presents with  . Otalgia      HPI Comments: Patient complains of a three week history of both ears feeling full with decreased hearing.  She does not feel particularly congested in her nose.  She has had a mild daily frontal headache, and feels intermittently dizzy.  She feels well otherwise. She has a history of seasonal allergies and takes Flonase and Claritin.  The history is provided by the patient.    Past Medical History  Diagnosis Date  . Depression   . History of chicken pox   . Adenomatous colon polyp 12/2008    gets colonoscopies every 3 years.    . Dysplastic nevi   . History of telogen effluvium   . Vitamin D deficiency    Past Surgical History  Procedure Laterality Date  . Hip surgery Right 07/13/12    Dr Thea [facility]   Family History  Problem Relation Age of Onset  . Colon cancer Mother 17  . Sudden death Neg Hx   . Hypertension Neg Hx   . Hyperlipidemia Neg Hx   . Heart attack Neg Hx   . Diabetes Neg Hx   . Colon polyps Maternal Grandmother    History  Substance Use Topics  . Smoking status: Never Smoker   . Smokeless tobacco: Never Used  . Alcohol Use: No   OB History   Grav Para Term Preterm Abortions TAB SAB Ect Mult Living                 Review of Systems No sore throat No cough No pleuritic pain No wheezing + nasal congestion ? post-nasal drainage No sinus pain/pressure No itchy/red eyes ? earache No hemoptysis No SOB No fever/chills No nausea No vomiting No abdominal pain No diarrhea No urinary symptoms No skin rash No fatigue No myalgias + headache Used OTC meds without relief  Allergies  Review of patient's allergies indicates no known allergies.  Home Medications   Prior to Admission medications   Medication Sig Start Date End Date Taking?  Authorizing Provider  BIOTIN FORTE PO Take 1 tablet by mouth daily.      Historical Provider, MD  Calcium Carbonate-Vitamin D (CALCIUM 600+D) 600-400 MG-UNIT per tablet Take 1 tablet by mouth 2 (two) times daily.     Historical Provider, MD  fluticasone (FLONASE) 50 MCG/ACT nasal spray Place 2 sprays into both nostrils daily. 02/16/13   Leeanne Rio, PA-C  loratadine (CLARITIN) 10 MG tablet Take 10 mg by mouth daily.    Historical Provider, MD  Melatonin 10 MG TABS Take by mouth at bedtime as needed.    Historical Provider, MD  Misc Natural Products (GLUCOSAMINE CHONDROITIN ADV PO) Take 2 tablets by mouth daily.      Historical Provider, MD  Multiple Vitamin (MULTIVITAMIN) tablet Take 1 tablet by mouth daily.      Historical Provider, MD   BP 101/67  Pulse 77  Temp(Src) 98.1 F (36.7 C) (Oral)  Ht 5' 5.5" (1.664 m)  Wt 143 lb 8 oz (65.091 kg)  BMI 23.51 kg/m2  SpO2 100%  LMP 04/21/2013 Physical Exam Nursing notes and Vital Signs reviewed. Appearance:  Patient appears healthy, stated age, and in no acute distress Eyes:  Pupils are equal, round, and reactive to light and accomodation.  Extraocular movement is intact.  Conjunctivae are not inflamed  Ears:  Canals normal.  Tympanic membranes normal.  Nose:  Mildly congested turbinates.  No sinus tenderness.    Pharynx:  Normal Neck:  Supple.  Slightly tender shotty posterior nodes are palpated bilaterally  Lungs:  Clear to auscultation.  Breath sounds are equal.  Heart:  Regular rate and rhythm without murmurs, rubs, or gallops.  Abdomen:  Nontender without masses or hepatosplenomegaly.  Bowel sounds are present.  No CVA or flank tenderness.  Extremities:  No edema.  No calf tenderness Skin:  No rash present.   ED Course  Procedures  None  Labs Review Tympanogram:  Normal left ear; Positive peak pressure right ear.         MDM   1. Eustachian tube dysfunction   2. Seasonal rhinitis    Begin prednisone burst. May use  Afrin nasal spray (or generic oxymetazoline) each morning for about 5 days.  Also recommend using saline nasal spray several times daily and saline nasal irrigation (AYR is a common brand).  Use Flonase spray after Afrin and saline irrigation. Stop all antihistamines for now, and other non-prescription cough/cold preparations. Followup with ENT if not improved about 10 days.    Kandra Nicolas, MD 05/09/13 1053

## 2013-05-06 NOTE — Discharge Instructions (Signed)
May use Afrin nasal spray (or generic oxymetazoline) each morning for about 5 days.  Also recommend using saline nasal spray several times daily and saline nasal irrigation (AYR is a common brand).  Use Flonase spray after Afrin and saline irrigation. Stop all antihistamines for now, and other non-prescription cough/cold preparations.

## 2013-05-21 ENCOUNTER — Ambulatory Visit: Payer: BC Managed Care – PPO | Admitting: Family

## 2013-07-03 ENCOUNTER — Telehealth: Payer: Self-pay | Admitting: Family

## 2013-07-03 NOTE — Telephone Encounter (Signed)
Answering for Lenna Sciara as she is out of town.  The last note concerning edema that I see is from 2012 and it was well-controlled at that time.  If Melissa were concerned about heart failure, then the next step would be to obtain labs and EKG and further cardiovascular testing including echocardiogram..  However, I recommend she schedule an appointment with [facility] after she returns.  If she is having SOB associated with bilateral leg swelling, schedule an appointment sooner. If she is having any bad SOB, chest pain, or swelling is only in one leg and/or associated with calf pain, she should go to the ER.

## 2013-07-03 NOTE — Telephone Encounter (Signed)
Patient left message stating she has discussed her issues with leg swelling with Melissa in the past and she was told the next step would be to do an EKG, she wants to know about proceeding with the EKG because her aunt was recently diagnosed with a non-functioning valve and one of her symptoms was swelling. Please advise

## 2013-07-03 NOTE — Telephone Encounter (Signed)
Called and spoke with patient, she is not having any symptoms at this time. She is scheduled to see [facility] 6/23 @ 11:15, patient advised to call back if she develops any symptoms or wants to be seen sooner

## 2013-07-10 ENCOUNTER — Ambulatory Visit (INDEPENDENT_AMBULATORY_CARE_PROVIDER_SITE_OTHER): Payer: BC Managed Care – PPO | Admitting: Family

## 2013-07-10 ENCOUNTER — Encounter: Payer: Self-pay | Admitting: Family

## 2013-07-10 VITALS — BP 112/80 | HR 72 | Temp 98.5°F | Ht 65.0 in | Wt 142.0 lb

## 2013-07-10 DIAGNOSIS — R609 Edema, unspecified: Secondary | ICD-10-CM

## 2013-07-10 NOTE — Assessment & Plan Note (Signed)
Suspect dependent edema due to chronic venous insufficiency.  Will order 2 D echo at pt request.  Advise pt ok to take lasix BID if needed. Obtain bmet today.

## 2013-07-10 NOTE — Patient Instructions (Addendum)
You will be contact about your referral.  Please let us know if you have not heard back within 1 week about your referral. Complete lab work prior to leaving.

## 2013-07-10 NOTE — Progress Notes (Signed)
Pre visit review using our clinic review tool, if applicable. No additional management support is needed unless otherwise documented below in the visit note. 

## 2013-07-10 NOTE — Progress Notes (Signed)
Subjective:    Patient ID: Marie Ortega, female    DOB: 1970/01/25, 43 y.o.   MRN: [id number]  HPI  [name] is a 43 yr old female who presents to discuss persistent LE swelling. This has been a problem for some time. She reports that she has been wearing compression stockings, taking lasix daily, and avoiding salt.  She denies SOB. These measures help some but do not resolve her LE edema.  She is very bothered by this. Reports that she has an aunt who has edema and was diagnosed with some "heart valve problems." She would like to be evaluated to exclude heart valve issues as a contributing factor to her swelling.   Review of Systems See HPI  Past Medical History  Diagnosis Date  . Depression   . History of chicken pox   . Adenomatous colon polyp 12/2008    gets colonoscopies every 3 years.    . Dysplastic nevi   . History of telogen effluvium   . Vitamin D deficiency     History   Social History  . Marital Status: Married    Spouse Name: N/A    Number of Children: 2  . Years of Education: N/A   Occupational History  . chemist    Social History Main Topics  . Smoking status: Never Smoker   . Smokeless tobacco: Never Used  . Alcohol Use: No  . Drug Use: No  . Sexual Activity: Not on file   Other Topics Concern  . Not on file   Social History Narrative   Caffeine use: none   Regular exercise:  No          Past Surgical History  Procedure Laterality Date  . Hip surgery Right 07/13/12    Dr Thea Silversmith [facility]    Family History  Problem Relation Age of Onset  . Colon cancer Mother 9  . Sudden death Neg Hx   . Hypertension Neg Hx   . Hyperlipidemia Neg Hx   . Heart attack Neg Hx   . Diabetes Neg Hx   . Colon polyps Maternal Grandmother     No Known Allergies  Current Outpatient Prescriptions on File Prior to Visit  Medication Sig Dispense Refill  . BIOTIN FORTE PO Take 1 tablet by mouth daily.        . Calcium  Carbonate-Vitamin D (CALCIUM 600+D) 600-400 MG-UNIT per tablet Take 1 tablet by mouth 2 (two) times daily.       . fluticasone (FLONASE) 50 MCG/ACT nasal spray Place 2 sprays into both nostrils daily.  16 g  6  . loratadine (CLARITIN) 10 MG tablet Take 10 mg by mouth daily.      . Melatonin 10 MG TABS Take by mouth at bedtime as needed.      . Misc Natural Products (GLUCOSAMINE CHONDROITIN ADV PO) Take 2 tablets by mouth daily.        . Multiple Vitamin (MULTIVITAMIN) tablet Take 1 tablet by mouth daily.         No current facility-administered medications on file prior to visit.    BP 112/80  Pulse 72  Temp(Src) 98.5 F (36.9 C) (Oral)  Ht 5\' 5"  (1.651 m)  Wt 142 lb (64.411 kg)  BMI 23.63 kg/m2  SpO2 98%  LMP 06/18/2013       Objective:   Physical Exam  Constitutional: She is oriented to person, place, and time. She appears well-developed and well-nourished. No distress.  Cardiovascular: Normal rate and regular rhythm.   No murmur heard. Pulmonary/Chest: Effort normal and breath sounds normal. No respiratory distress. She has no wheezes. She has no rales. She exhibits no tenderness.  Musculoskeletal:  Trace bilateral LE edema  Neurological: She is alert and oriented to person, place, and time.  Psychiatric: She has a normal mood and affect. Her behavior is normal. Judgment and thought content normal.          Assessment & Plan:

## 2013-07-11 ENCOUNTER — Encounter: Payer: Self-pay | Admitting: Family

## 2013-07-11 LAB — BASIC METABOLIC PANEL
BUN: 15 mg/dL (ref 6–23)
CO2: 29 meq/L (ref 19–32)
Calcium: 9.9 mg/dL (ref 8.4–10.5)
Chloride: 102 mEq/L (ref 96–112)
Creat: 0.76 mg/dL (ref 0.50–1.10)
Glucose, Bld: 84 mg/dL (ref 70–99)
POTASSIUM: 4.4 meq/L (ref 3.5–5.3)
SODIUM: 139 meq/L (ref 135–145)

## 2013-07-18 ENCOUNTER — Ambulatory Visit (HOSPITAL_BASED_OUTPATIENT_CLINIC_OR_DEPARTMENT_OTHER): Payer: BC Managed Care – PPO

## 2013-08-02 ENCOUNTER — Other Ambulatory Visit: Payer: Self-pay | Admitting: Family

## 2013-08-02 NOTE — Telephone Encounter (Signed)
Rx request to pharmacy/SLS  

## 2013-10-10 ENCOUNTER — Ambulatory Visit (HOSPITAL_BASED_OUTPATIENT_CLINIC_OR_DEPARTMENT_OTHER)
Admission: RE | Admit: 2013-10-10 | Discharge: 2013-10-10 | Disposition: A | Discharge status: Home / Self Care | Payer: BC Managed Care – PPO | Source: Ambulatory Visit | Attending: Family | Admitting: Family

## 2013-10-10 DIAGNOSIS — I369 Nonrheumatic tricuspid valve disorder, unspecified: Secondary | ICD-10-CM

## 2013-10-10 DIAGNOSIS — R609 Edema, unspecified: Secondary | ICD-10-CM | POA: Insufficient documentation

## 2013-10-10 DIAGNOSIS — R110 Nausea: Secondary | ICD-10-CM | POA: Insufficient documentation

## 2013-10-10 NOTE — Progress Notes (Signed)
*  PRELIMINARY RESULTS* Echocardiogram 2D Echocardiogram has been performed.  Marie Ortega 10/10/2013, 12:35 PM

## 2013-10-11 ENCOUNTER — Encounter: Payer: Self-pay | Admitting: Family

## 2013-12-19 ENCOUNTER — Other Ambulatory Visit: Payer: Self-pay | Admitting: Family

## 2013-12-19 NOTE — Telephone Encounter (Signed)
Pt last seen 07/10/13 and has no future appts on file.  Furosemide refills sent. Please advise when pt is due for follow up in the office?

## 2013-12-19 NOTE — Telephone Encounter (Signed)
Needs 6 month follow up please.  

## 2014-05-14 ENCOUNTER — Encounter: Payer: Self-pay | Admitting: Family Medicine

## 2014-05-14 ENCOUNTER — Ambulatory Visit (INDEPENDENT_AMBULATORY_CARE_PROVIDER_SITE_OTHER): Payer: BLUE CROSS/BLUE SHIELD | Admitting: Family Medicine

## 2014-05-14 ENCOUNTER — Telehealth: Payer: Self-pay

## 2014-05-14 VITALS — BP 107/72 | HR 73 | Temp 98.3°F | Wt 141.0 lb

## 2014-05-14 DIAGNOSIS — S134XXA Sprain of ligaments of cervical spine, initial encounter: Secondary | ICD-10-CM | POA: Diagnosis not present

## 2014-05-14 DIAGNOSIS — R1010 Upper abdominal pain, unspecified: Secondary | ICD-10-CM

## 2014-05-14 DIAGNOSIS — R102 Pelvic and perineal pain: Secondary | ICD-10-CM

## 2014-05-14 LAB — POCT URINALYSIS DIPSTICK
Blood, UA: NEGATIVE
Glucose, UA: NEGATIVE
NITRITE UA: NEGATIVE
Urobilinogen, UA: 2
pH, UA: 6

## 2014-05-14 MED ORDER — CYCLOBENZAPRINE HCL 10 MG PO TABS: 10.0000 mg | ORAL_TABLET | Freq: Three times a day (TID) | ORAL | Status: DC | PRN
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
## 2014-05-14 NOTE — Patient Instructions (Signed)

## 2014-05-14 NOTE — Progress Notes (Signed)
Pre visit review using our clinic review tool, if applicable. No additional management support is needed unless otherwise documented below in the visit note. 
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
## 2014-05-16 ENCOUNTER — Ambulatory Visit (HOSPITAL_BASED_OUTPATIENT_CLINIC_OR_DEPARTMENT_OTHER): Admission: RE | Admit: 2014-05-16 | Payer: BLUE CROSS/BLUE SHIELD | Source: Ambulatory Visit

## 2014-05-16 ENCOUNTER — Ambulatory Visit (HOSPITAL_BASED_OUTPATIENT_CLINIC_OR_DEPARTMENT_OTHER)
Admission: RE | Admit: 2014-05-16 | Discharge: 2014-05-16 | Disposition: A | Discharge status: Home / Self Care | Payer: BLUE CROSS/BLUE SHIELD | Source: Ambulatory Visit | Attending: Family Medicine | Admitting: Family Medicine

## 2014-05-16 ENCOUNTER — Ambulatory Visit (HOSPITAL_BASED_OUTPATIENT_CLINIC_OR_DEPARTMENT_OTHER): Payer: BLUE CROSS/BLUE SHIELD

## 2014-05-16 DIAGNOSIS — D259 Leiomyoma of uterus, unspecified: Secondary | ICD-10-CM | POA: Insufficient documentation

## 2014-05-16 DIAGNOSIS — R102 Pelvic and perineal pain: Secondary | ICD-10-CM

## 2014-05-16 LAB — URINE CULTURE
Colony Count: NO GROWTH
Organism ID, Bacteria: NO GROWTH

## 2014-05-17 ENCOUNTER — Ambulatory Visit (HOSPITAL_BASED_OUTPATIENT_CLINIC_OR_DEPARTMENT_OTHER): Payer: BLUE CROSS/BLUE SHIELD

## 2014-06-10 ENCOUNTER — Telehealth: Payer: Self-pay | Admitting: Family
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
## 2014-06-21 ENCOUNTER — Telehealth: Payer: Self-pay | Admitting: Family
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
## 2014-06-21 NOTE — Telephone Encounter (Signed)
Please advise      KP 

## 2014-06-21 NOTE — Telephone Encounter (Signed)
They only did pelvic US --- abd one was not done
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
## 2014-07-01 ENCOUNTER — Telehealth: Payer: Self-pay | Admitting: Family
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
## 2014-07-01 NOTE — Telephone Encounter (Signed)
Patient aware the letter is ready for pick up and verbalized understanding.     KP
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
## 2014-07-01 NOTE — Telephone Encounter (Signed)
Caller name:Lilley Bilodeau Relationship to patient:self Can be reached: Pharmacy:  Reason for call:patient

## 2014-09-02 ENCOUNTER — Ambulatory Visit (INDEPENDENT_AMBULATORY_CARE_PROVIDER_SITE_OTHER): Payer: BLUE CROSS/BLUE SHIELD | Admitting: Family

## 2014-09-02 ENCOUNTER — Encounter: Payer: Self-pay | Admitting: Family

## 2014-09-02 VITALS — BP 100/72 | HR 80 | Temp 97.9°F | Resp 16 | Ht 65.0 in | Wt 143.6 lb

## 2014-09-02 DIAGNOSIS — H8110 Benign paroxysmal vertigo, unspecified ear: Secondary | ICD-10-CM

## 2014-09-02 MED ORDER — MECLIZINE HCL 25 MG PO TABS: 25.0000 mg | ORAL_TABLET | Freq: Three times a day (TID) | ORAL | Status: DC | PRN

## 2014-09-02 NOTE — Progress Notes (Signed)
Subjective:    Patient ID: Marie Ortega, female    DOB: July 20, 1970, 44 y.o.   MRN: [id number]  HPI  Marie Ortega is a 44 yr old female who presents today with chief complaint of dizziness.  Reports that she has had dizziness intermittently x 2 weeks. Reports R ear pain x 2 days.  Flew to [place] and symptoms starte a few days later. She reports that she is less dizzy today.  She denies nasal congestion.  +HA- temporal/frontal.  No fever.  Took some dayquil on thurs and Friday. Did not help her symptoms. Ibuprofen yesterday helped her HA.     Review of Systems See HPI  Past Medical History  Diagnosis Date  . Depression   . History of chicken pox   . Adenomatous colon polyp 12/2008    gets colonoscopies every 3 years.    . Dysplastic nevi   . History of telogen effluvium   . Vitamin D deficiency     Social History   Social History  . Marital Status: Married    Spouse Name: Marie Ortega  . Number of Children: 2  . Years of Education: Marie Ortega   Occupational History  . chemist    Social History Main Topics  . Smoking status: Never Smoker   . Smokeless tobacco: Never Used  . Alcohol Use: No  . Drug Use: No  . Sexual Activity: Not on file   Other Topics Concern  . Not on file   Social History Narrative   Caffeine use: none   Regular exercise:  No          Past Surgical History  Procedure Laterality Date  . Hip surgery Right 07/13/12    Dr Thea Silversmith [facility]    Family History  Problem Relation Age of Onset  . Colon cancer Mother 33  . Sudden death Neg Hx   . Hypertension Neg Hx   . Hyperlipidemia Neg Hx   . Heart attack Neg Hx   . Diabetes Neg Hx   . Colon polyps Maternal Grandmother     No Known Allergies  Current Outpatient Prescriptions on File Prior to Visit  Medication Sig Dispense Refill  . BIOTIN FORTE PO Take 1 tablet by mouth daily.      . Calcium Carbonate-Vitamin D (CALCIUM 600+D) 600-400 MG-UNIT per tablet Take 1 tablet by mouth 2  (two) times daily.     . cyclobenzaprine (FLEXERIL) 10 MG tablet Take 1 tablet (10 mg total) by mouth 3 (three) times daily as needed for muscle spasms. 30 tablet 0  . fluticasone (FLONASE) 50 MCG/ACT nasal spray Place 2 sprays into both nostrils daily. (Patient taking differently: Place 2 sprays into both nostrils as needed. ) 16 g 6  . furosemide (LASIX) 20 MG tablet TAKE 1 TABLET EVERY DAY AS NEEDED 30 tablet 2  . loratadine (CLARITIN) 10 MG tablet Take 10 mg by mouth as needed.     . Melatonin 10 MG TABS Take by mouth at bedtime as needed.    . Misc Natural Products (GLUCOSAMINE CHONDROITIN ADV PO) Take 2 tablets by mouth daily.      . Multiple Vitamin (MULTIVITAMIN) tablet Take 1 tablet by mouth daily.       No current facility-administered medications on file prior to visit.    BP 100/72 mmHg  Pulse 80  Temp(Src) 97.9 F (36.6 C) (Oral)  Resp 16  Ht 5\' 5"  (1.651 m)  Wt 143 lb 9.6 oz (65.137 kg)  BMI 23.90 kg/m2  SpO2 99%  LMP 08/27/2014       Objective:   Physical Exam  Constitutional: She is oriented to person, place, and time. She appears well-developed and well-nourished. No distress.  HENT:  Head: Normocephalic and atraumatic.  Right Ear: Tympanic membrane and ear canal normal.  Left Ear: Tympanic membrane and ear canal normal.  Mouth/Throat: No oropharyngeal exudate, posterior oropharyngeal edema or posterior oropharyngeal erythema.  Cardiovascular: Normal rate and regular rhythm.   No murmur heard. Pulmonary/Chest: Effort normal and breath sounds normal. No respiratory distress. She has no wheezes. She has no rales. She exhibits no tenderness.  Neurological: She is alert and oriented to person, place, and time.  Psychiatric: She has a normal mood and affect. Her behavior is normal. Judgment and thought content normal.          Assessment & Plan:  Vertigo- symptoms most consistent with vertigo. Advised trial of meclizine- follow up if symptoms worsen or if  symptoms are not improved in 1 week. Pt verbalizes understanding.

## 2014-09-02 NOTE — Progress Notes (Signed)
Pre visit review using our clinic review tool, if applicable. No additional management support is needed unless otherwise documented below in the visit note. 

## 2014-09-02 NOTE — Patient Instructions (Signed)
Try meclizine as needed for vertigo. You may use tylenol or motrin as needed for Headache. Call if worsening ear pain, if symptoms worsen or if not improvedin 1 wek.  Benign Positional Vertigo Vertigo means you feel like you or your surroundings are moving when they are not. Benign positional vertigo is the most common form of vertigo. Benign means that the cause of your condition is not serious. Benign positional vertigo is more common in older adults. CAUSES  Benign positional vertigo is the result of an upset in the labyrinth system. This is an area in the middle ear that helps control your balance. This may be caused by a viral infection, head injury, or repetitive motion. However, often no specific cause is found. SYMPTOMS  Symptoms of benign positional vertigo occur when you move your head or eyes in different directions. Some of the symptoms may include:  Loss of balance and falls.  Vomiting.  Blurred vision.  Dizziness.  Nausea.  Involuntary eye movements (nystagmus). DIAGNOSIS  Benign positional vertigo is usually diagnosed by physical exam. If the specific cause of your benign positional vertigo is unknown, your caregiver may perform imaging tests, such as magnetic resonance imaging (MRI) or computed tomography (CT). TREATMENT  Your caregiver may recommend movements or procedures to correct the benign positional vertigo. Medicines such as meclizine, benzodiazepines, and medicines for nausea may be used to treat your symptoms. In rare cases, if your symptoms are caused by certain conditions that affect the inner ear, you may need surgery. HOME CARE INSTRUCTIONS   Follow your caregiver's instructions.  Move slowly. Do not make sudden body or head movements.  Avoid driving.  Avoid operating heavy machinery.  Avoid performing any tasks that would be dangerous to you or others during a vertigo episode.  Drink enough fluids to keep your urine clear or pale yellow. SEEK  IMMEDIATE MEDICAL CARE IF:   You develop problems with walking, weakness, numbness, or using your arms, hands, or legs.  You have difficulty speaking.  You develop severe headaches.  Your nausea or vomiting continues or gets worse.  You develop visual changes.  Your family or friends notice any behavioral changes.  Your condition gets worse.  You have a fever.  You develop a stiff neck or sensitivity to light. MAKE SURE YOU:   Understand these instructions.  Will watch your condition.  Will get help right away if you are not doing well or get worse. Document Released: 10/12/2005 Document Revised: 03/29/2011 Document Reviewed: 09/24/2010 [facility] Patient Information 2015 [place], [place]. This information is not intended to replace advice given to you by your health care provider. Make sure you discuss any questions you have with your health care provider.

## 2015-02-10 ENCOUNTER — Encounter: Payer: Self-pay | Admitting: Gastroenterology

## 2015-03-19 ENCOUNTER — Telehealth: Payer: Self-pay | Admitting: Family

## 2015-03-19 NOTE — Telephone Encounter (Signed)
Pt had flu shot 01/2015 at [facility] with Dr. Tomasa Blase.

## 2015-03-19 NOTE — Telephone Encounter (Signed)
Health maintenance updated

## 2015-04-10 ENCOUNTER — Ambulatory Visit: Payer: BLUE CROSS/BLUE SHIELD | Attending: Urology | Admitting: Physical Therapy

## 2015-04-10 DIAGNOSIS — R29898 Other symptoms and signs involving the musculoskeletal system: Secondary | ICD-10-CM | POA: Insufficient documentation

## 2015-04-10 DIAGNOSIS — M25551 Pain in right hip: Secondary | ICD-10-CM | POA: Diagnosis present

## 2015-04-10 DIAGNOSIS — R262 Difficulty in walking, not elsewhere classified: Secondary | ICD-10-CM | POA: Insufficient documentation

## 2015-04-10 NOTE — Therapy (Signed)
[facility] [address]  [place], [place], [address] Phone: [phone]   Fax:  [phone]  Physical Therapy Evaluation  Patient Details  Name: Marie Ortega MRN: [id number] Date of Birth: [birth year] Referring Provider: Phill Myron, MD  Encounter Date: 04/10/2015      PT End of Session - 04/10/15 1220    Visit Number 1   Number of Visits 16   Date for PT Re-Evaluation 06/05/15   PT Start Time 1108   PT Stop Time 1202   PT Time Calculation (min) 54 min   Activity Tolerance Patient tolerated treatment well   Behavior During Therapy [facility] for tasks assessed/performed      Past Medical History  Diagnosis Date  . Depression   . History of chicken pox   . Adenomatous colon polyp 12/2008    gets colonoscopies every 3 years.    . Dysplastic nevi   . History of telogen effluvium   . Vitamin D deficiency     Past Surgical History  Procedure Laterality Date  . Hip surgery Right 07/13/12    Dr Thea Silversmith [facility]    There were no vitals filed for this visit.  Visit Diagnosis:  Right hip pain  Difficulty walking  Right leg weakness      Subjective Assessment - 04/10/15 1115    Subjective Pt reports 1st pain in R hip occurred back in 2013 when stepping out of he car. Diasnosed with labral tear. Tried conservative therapy with no improvement, therefore underwent arthroscopic labral repair. Good recovery with therapy after suergery. Most recent pain flared up after a fall landing on her R hip in February and again earlier this month when she   Pertinent History Arthroscopic R hip labral repair 06/2012   Limitations Walking   How long can you walk comfortably? Unpredictable   Diagnostic tests recent x-rays unremarkable per pt report   Patient Stated Goals "Fix it"   Currently in Pain? Yes   Pain Score 5   Least 1/0 (with ibuprofen), Avg 5/10, Worst 10/10   Pain Location Hip   Pain Orientation Right;Anterior;Lateral;Posterior   Pain Descriptors / Indicators Aching   Pain Type Acute pain   Pain Onset More than a month ago   Pain Frequency Constant   Aggravating Factors  Unable to identify specific trigger   Pain Relieving Factors Ibuprofen   Effect of Pain on Daily Activities Unpredictable            [facility] PT Assessment - 04/10/15 1108    Assessment   Medical Diagnosis R hip pain   Referring Provider Phill Myron, MD   Onset Date/Surgical Date --  Feb 2017   Next MD Visit as needed   Prior Therapy PT at time of initial injury in 2013, and again post-op after surgery in 2014   Balance Screen   Has the patient fallen in the past 6 months Yes   How many times? 1   Has the patient had a decrease in activity level because of a fear of falling?  No   Is the patient reluctant to leave their home because of a fear of falling?  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   Leisure Walking, sports with kids   Observation/Other Assessments   Focus on Therapeutic Outcomes (FOTO)  Hip -  65% (35% limitation); Predicted 74% (26% limitation)   ROM / Strength   AROM / PROM / Strength AROM;Strength   AROM   AROM Assessment Site Lumbar;Hip   Lumbar Flexion 80%  no pain   Lumbar Extension WFL   Lumbar - Right Side Bend WFL   Lumbar - Left Side Bend 75%  tight, but no pain   Lumbar - Right Rotation 75%  tight, but no pain   Lumbar - Left Rotation [facility]   Strength   Strength Assessment Site Hip   Right/Left Hip Right;Left   Right Hip Flexion 4-/5   Right Hip Extension 4/5   Right Hip External Rotation  4-/5   Right Hip Internal Rotation 4-/5   Right Hip ABduction 4-/5   Right Hip ADduction 4-/5   Left Hip Flexion 4+/5   Left Hip Extension 4+/5   Left Hip External Rotation 4/5   Left Hip Internal Rotation 4/5   Left Hip ABduction 5/5    Left Hip ADduction 5/5   Flexibility   Soft Tissue Assessment /Muscle Length yes   Hamstrings mildly tight on R   Quadriceps mildly tight on R with mod tightness in RF   ITB WNL   Piriformis moderately tight on R   Palpation   SI assessment  apparent slight posterior innominate rotation on R          Today's Treatment  Manual MET for correction of R posterior innominate rotation of pelvis on sacrum followed pelvic shotgun - No cavitation noted, but alignment neutralized upon reassessment  TherEx  Initial HEP stretches:   R Supine hamstring stretch with strap 2x30"   R SKTC x30"   R Thomas hip flexor stretch x30"   R Prone rectus femoris stretch with strap x30"   R 1/2 kneeling/lunge position stretch for RF x30"   R Seated piriformis stretch x30"            PT Education - 04/10/15 1227    Education provided Yes   Education Details PT eval findings, POC and initial HEP   Person(s) Educated Patient   Methods Explanation;Demonstration;Handout   Comprehension Verbalized understanding;Returned demonstration          PT Short Term Goals - 04/10/15 1925    PT SHORT TERM GOAL #1   Title Independent with inital HEP by 05/01/15   Status New           PT Long Term Goals - 04/10/15 1926    PT LONG TERM GOAL #1   Title Independent with advanced HEP as indicated by 06/05/15   Status New   PT LONG TERM GOAL #2   Title Pt will demostrate R hip strength at 4/5 to 4+/5 or greater by 06/05/15   Status New   PT LONG TERM GOAL #3   Title Pt will report ability to complete normal daily activities including walking without limitation due to R hip pain by 06/05/15   Status New               Plan - 04/10/15 1909    Clinical Impression Statement Pt presents to OP PT with R hip pain. Pt has previously has arthroscopic labral repair of the R hip in 06/2012 with good receovery and return to full function without pain. Current pain originated after a fall back in early  February (although no pain noted at time of fall) and exacerbated more recently when rising from a chair at work. Pain currently 5/10 in anterior  and lateral hip but pt reports pain sometimes extends into posterior hip as well. Assessment revealed tightness in proximal R LE musculature with apparent slight R posterior innominate rotation of pelvis at the SIJ which was successfully corrected with MET. Weakness also noted in R hip musculature relative to right. Pt reports no predictable trigger for pain and states impact on daily functional tasks is also unpredictable, but feels that walking is most limited when pain interferes with daily activities.    Pt will benefit from skilled therapeutic intervention in order to improve on the following deficits Pain;Impaired flexibility;Decreased range of motion;Increased muscle spasms;Decreased strength;Difficulty walking;Decreased activity tolerance   Rehab Potential Good   PT Frequency 2x / week   PT Duration 8 weeks   PT Treatment/Interventions Patient/family education;Manual techniques;Taping;Therapeutic exercise;Therapeutic activities;Neuromuscular re-education;Ultrasound;Moist Heat;Electrical Stimulation;Cryotherapy;Iontophoresis 36m/ml Dexamethasone   PT Next Visit Plan Re-assess SIJ alignment and address PRN; Review initial HEP stretches; Initiate core and peripelvic strenghening and update HEP PRN; Manual therapy, Taping and Modailities PRN for pain   Consulted and Agree with Plan of Care Patient         Problem List Patient Active Problem List   Diagnosis Date Noted  . Edema 07/10/2013  . Sinusitis 02/16/2013  . Left otitis media 02/16/2013  . Insomnia 08/30/2012  . Word finding difficulty 08/30/2012  . Routine general medical examination at a health care facility 03/13/2012  . Nausea 11/09/2011  . Hx of colonic polyps 10/18/2011  . Right hip pain 09/08/2011  . Eczema 05/31/2011  . General medical examination 03/08/2011  . Dysplastic nevi  11/03/2010  . Anxiety and depression 06/05/2010  . Somnolence 05/19/2010  . Vitamin D deficiency 05/19/2010    JPercival Spanish PT, MPT 04/10/2015, 7:35 PM  [facility] [place] [address]Phone: [phone]  Fax:  [phone] Name: Marie SINDTMRN: [id number] of Birth: [birth year]

## 2015-04-15 ENCOUNTER — Ambulatory Visit: Payer: BLUE CROSS/BLUE SHIELD | Admitting: Physical Therapy

## 2015-04-15 DIAGNOSIS — R29898 Other symptoms and signs involving the musculoskeletal system: Secondary | ICD-10-CM

## 2015-04-15 DIAGNOSIS — R262 Difficulty in walking, not elsewhere classified: Secondary | ICD-10-CM

## 2015-04-15 DIAGNOSIS — M25551 Pain in right hip: Secondary | ICD-10-CM | POA: Diagnosis not present

## 2015-04-15 NOTE — Therapy (Signed)
[facility] [address]  [place], [place], [address] Phone: [phone]   Fax:  [phone]  Physical Therapy Treatment  Patient Details  Name: Marie Ortega MRN: [id number] Date of Birth: [birth year] Referring Provider: Phill Myron, MD  Encounter Date: 04/15/2015      PT End of Session - 04/15/15 1114    Visit Number 2   Number of Visits 16   Date for PT Re-Evaluation 06/05/15   PT Start Time 1108   PT Stop Time 1152   PT Time Calculation (min) 44 min   Activity Tolerance Patient tolerated treatment well   Behavior During Therapy [facility] for tasks assessed/performed      Past Medical History  Diagnosis Date  . Depression   . History of chicken pox   . Adenomatous colon polyp 12/2008    gets colonoscopies every 3 years.    . Dysplastic nevi   . History of telogen effluvium   . Vitamin D deficiency     Past Surgical History  Procedure Laterality Date  . Hip surgery Right 07/13/12    Dr Thea Silversmith [facility]    There were no vitals filed for this visit.  Visit Diagnosis:  Right hip pain  Difficulty walking  Right leg weakness      Subjective Assessment - 04/15/15 1113    Subjective Pt feels that stretches are helping with pain.   Currently in Pain? Yes   Pain Score 2    Pain Location Hip   Pain Orientation Right          Today's Treatment  SIJ rechecked and found to be in neutral alignment, therefore proceeded with review of HEP stretches   TherEx  R Supine hamstring stretch with strap 2x30" R SKTC 2x30" R Thomas hip flexor stretch 2x30" R Prone rectus femoris stretch with strap 2x30" R 1/2 kneeling/lunge position stretch for RF x30" R Seated piriformis stretch x30"  Abdominal bracing 10x5" TrA + Bridge + B Hip ADD ball squeeze 10x5" TrA + Bridge + B Hip ABD isometric with blue TB 10x5" Brace marching with blue TB 10x3" TrA + Hooklying Alternating B  Hip ABD/ER clam with blue TB 10x3" L side-lying R Hip ABD/ER clam with blue TB 10x3"          PT Education - 04/15/15 1204    Education provided Yes   Education Details Initial core/peripelvic strengthening HEP   Person(s) Educated Patient   Methods Explanation;Demonstration;Handout   Comprehension Verbalized understanding;Returned demonstration          PT Short Term Goals - 04/15/15 1159    PT SHORT TERM GOAL #1   Title Independent with inital HEP by 05/01/15   Status Partially Met  Met for initial stretches; strengthening program added today           PT Long Term Goals - 04/15/15 1200    PT LONG TERM GOAL #1   Title Independent with advanced HEP as indicated by 06/05/15   Status On-going   PT LONG TERM GOAL #2   Title Pt will demostrate R hip strength at 4/5 to 4+/5 or greater by 06/05/15   Status On-going   PT LONG TERM GOAL #3   Title Pt will report ability to complete normal daily activities including walking without limitation due to R hip pain by 06/05/15   Status On-going  Plan - 04/15/15 1154    Clinical Impression Statement SIJ remains in neutral alignment after MET at last treatment. Pt noting good initial relief of pain from stretching and able to mitigate pain when it exacerbates with stretches. Reviewed HEP stretches with pt able to demonstrate all stretches appropriately. Introduced core stabilization and basic core/peripelvic strengthening with intial strengthening HEP provided.   PT Next Visit Plan Re-assess SIJ alignment and address PRN; Review initial HEP stretches; Initiate core and peripelvic strenghening and update HEP PRN; Manual therapy, Taping and Modailities PRN for pain   Consulted and Agree with Plan of Care Patient        Problem List Patient Active Problem List   Diagnosis Date Noted  . Edema 07/10/2013  . Sinusitis 02/16/2013  . Left otitis media 02/16/2013  . Insomnia 08/30/2012  . Word finding difficulty  08/30/2012  . Routine general medical examination at a health care facility 03/13/2012  . Nausea 11/09/2011  . Hx of colonic polyps 10/18/2011  . Right hip pain 09/08/2011  . Eczema 05/31/2011  . General medical examination 03/08/2011  . Dysplastic nevi 11/03/2010  . Anxiety and depression 06/05/2010  . Somnolence 05/19/2010  . Vitamin D deficiency 05/19/2010    Percival Spanish, PT, MPT 04/15/2015, 12:04 PM  [facility] [address]  [place], [place], [address] Phone: [phone]   Fax:  [phone]  Name: Marie Ortega MRN: [id number] Date of Birth: 05/04/1970

## 2015-04-24 ENCOUNTER — Ambulatory Visit: Payer: BLUE CROSS/BLUE SHIELD | Admitting: Physical Therapy

## 2015-05-07 ENCOUNTER — Ambulatory Visit: Payer: BLUE CROSS/BLUE SHIELD | Attending: Urology | Admitting: Physical Therapy

## 2015-05-07 DIAGNOSIS — R262 Difficulty in walking, not elsewhere classified: Secondary | ICD-10-CM

## 2015-05-07 DIAGNOSIS — M25551 Pain in right hip: Secondary | ICD-10-CM | POA: Diagnosis present

## 2015-05-07 NOTE — Therapy (Addendum)
[facility] [address]  [address] [place], [place], [address] Phone: [phone]   Fax:  [phone]  Physical Therapy Treatment  Patient Details  Name: Marie Ortega MRN: [id number] Date of Birth: Sep 12, 1970 Referring Provider: Emilio Math, MD  Encounter Date: 05/07/2015      PT End of Session - 05/07/15 1158    Visit Number 3   Number of Visits 16   Date for PT Re-Evaluation 06/05/15   PT Start Time 1150   PT Stop Time 1232   PT Time Calculation (min) 42 min   Activity Tolerance Patient tolerated treatment well   Behavior During Therapy [facility] for tasks assessed/performed      Past Medical History  Diagnosis Date  . Depression   . History of chicken pox   . Adenomatous colon polyp 12/2008    gets colonoscopies every 3 years.    . Dysplastic nevi   . History of telogen effluvium   . Vitamin D deficiency     Past Surgical History  Procedure Laterality Date  . Hip surgery Right 07/13/12    Dr [facility]    There were no vitals filed for this visit.      Subjective Assessment - 05/07/15 1154    Subjective Pt reports minimal to no pain most of the time but does note sensation of hip locking up if she stops and stands still for a period of time, with brief "sharp grabbing" pain up to 8/10 when starting back to walking. Pain typically resolves after walking several steps. Continues to note good relief with stretches, but admits to limited opportunity to complete strengthening/stabilization exercises therefore unable to identify any issues with these exercises.   Currently in Pain? Yes   Pain Score 1    Pain Location Hip   Pain Orientation Right   Pain Descriptors / Indicators Aching          Today's Treatment  SIJ rechecked and found to be in neutral alignment, therefore proceeded with review of HEP strengthening exercises (pt denied need for review of stretches) along  with progression of exercises:  TherEx  Elliptical - lvl 1.5 x 4' Abdominal bracing 10x5" TrA + Bridge + B Hip ADD ball squeeze 10x5" TrA + Bridge + B Hip ABD isometric with blue TB 10x5" Brace marching with blue TB 10x3" TrA + Hooklying Alternating B Hip ABD/ER clam with blue TB 10x3" Bridge + Alternating B Hip ABD/ER clam with blue TB 10x3" L side-lying R Hip ABD/ER clam with blue TB 10x3" Straight leg bridge with feet on orange (55 cm) Pball 10x5" Bridge + HS curl with feet on orange (55 cm) Pball x10 B Side-stepping in partial squat with green TB around ankles 2x30ft Monster walk with green TB 2x62ft B Fitter Hip Extension & Abduction (2 blue) x10 each          PT Short Term Goals - 05/07/15 1325    PT SHORT TERM GOAL #1   Title Independent with inital HEP by 05/01/15   Status Partially Met  Met for initial stretches; able to demo strengthening program exercise today during therapy but admits to limited compliance at home           PT Long Term Goals - 05/07/15 1326    PT LONG TERM GOAL #1   Title Independent with advanced HEP as indicated by 06/05/15   Status On-going   PT LONG TERM GOAL #2  Title Pt will demostrate R hip strength at 4/5 to 4+/5 or greater by 06/05/15   Status On-going   PT LONG TERM GOAL #3   Title Pt will report ability to complete normal daily activities including walking without limitation due to R hip pain by 06/05/15   Status On-going               Plan - 05/07/15 1322    Clinical Impression Statement Pt reporting continued relief of pain with stretches but admits to limited compliance with strengthening/stabilization exercises, therefore unable to determine benefit or potential issues with these exercises. Pt able to perform all of HEP strengthening/stabilization exercises without issues today, but did note some increased fatigue and discomfort with new exercises attempted today especially side-stepping and monster walk with green  theraband. Pt reporting pain overall decreased but continues to note sensation of hip locking up if stops for period of time while walking followed by brief sharp, grabbing pain when resuming ambulation.   PT Treatment/Interventions Patient/family education;Manual techniques;Taping;Therapeutic exercise;Therapeutic activities;Neuromuscular re-education;Ultrasound;Moist Heat;Electrical Stimulation;Cryotherapy;Iontophoresis '4mg'$ /ml Dexamethasone;ADLs/Self Care Home Management   PT Next Visit Plan Re-assess SIJ alignment and address PRN; Progress core and peripelvic strenghening and update HEP PRN; Manual therapy, Taping and Modailities PRN for pain   Consulted and Agree with Plan of Care Patient      Patient will benefit from skilled therapeutic intervention in order to improve the following deficits and impairments:  Pain, Impaired flexibility, Decreased range of motion, Increased muscle spasms, Decreased strength, Difficulty walking, Decreased activity tolerance  Visit Diagnosis: Pain in right hip  Difficulty in walking, not elsewhere classified     Problem List Patient Active Problem List   Diagnosis Date Noted  . Edema 07/10/2013  . Sinusitis 02/16/2013  . Left otitis media 02/16/2013  . Insomnia 08/30/2012  . Word finding difficulty 08/30/2012  . Routine general medical examination at a health care facility 03/13/2012  . Nausea 11/09/2011  . Hx of colonic polyps 10/18/2011  . Right hip pain 09/08/2011  . Eczema 05/31/2011  . General medical examination 03/08/2011  . Dysplastic nevi 11/03/2010  . Anxiety and depression 06/05/2010  . Somnolence 05/19/2010  . Vitamin D deficiency 05/19/2010    Percival Spanish, PT, MPT 05/07/2015, 1:32 PM  [facility] [address]  [place], [place], [address] Phone: [phone]   Fax:  [phone]  Name: Marie Ortega MRN: [id number] Date of Birth: 02/03/1970   PHYSICAL  THERAPY DISCHARGE SUMMARY  Visits from Start of Care: 3  Current functional level related to goals / functional outcomes:   Pt only attended 3 visits since [facility] with limited compliance with HEP especially with strengthening/stabilization exercises, although pt relief of pain with stretches.  Pt was reporting pain overall decreased but continued to note sensation of hip locking up if stops for period of time while walking followed by brief sharp, grabbing pain when resuming ambulation.Unable to formally assess functional and/or goal status at discharge secondary to failure of pt to return for further visits.   Remaining deficits:   Unknown due to pt failure to return for further PT.   Education / Equipment:   HEP   Plan: Patient agrees to discharge.  Patient goals were not met. Patient is being discharged due to not returning since the last visit.  ?????       Percival Spanish, PT, MPT 06/23/2015, 1:33 PM  [place] [phone]  [employer]  [place], [place], [address] Phone: [phone]   Fax:  [phone]

## 2015-05-13 ENCOUNTER — Ambulatory Visit: Payer: BLUE CROSS/BLUE SHIELD | Admitting: Physical Therapy

## 2015-05-16 ENCOUNTER — Ambulatory Visit: Payer: BLUE CROSS/BLUE SHIELD | Admitting: Physical Therapy

## 2016-02-10 ENCOUNTER — Ambulatory Visit (INDEPENDENT_AMBULATORY_CARE_PROVIDER_SITE_OTHER): Payer: BLUE CROSS/BLUE SHIELD | Admitting: Family

## 2016-02-10 ENCOUNTER — Encounter: Payer: Self-pay | Admitting: Family

## 2016-02-10 VITALS — BP 112/72 | HR 81 | Temp 98.5°F | Resp 16 | Ht 65.0 in | Wt 142.4 lb

## 2016-02-10 DIAGNOSIS — F418 Other specified anxiety disorders: Secondary | ICD-10-CM

## 2016-02-10 DIAGNOSIS — I10 Essential (primary) hypertension: Secondary | ICD-10-CM

## 2016-02-10 DIAGNOSIS — F419 Anxiety disorder, unspecified: Secondary | ICD-10-CM

## 2016-02-10 DIAGNOSIS — F329 Major depressive disorder, single episode, unspecified: Secondary | ICD-10-CM

## 2016-02-10 DIAGNOSIS — F32A Depression, unspecified: Secondary | ICD-10-CM

## 2016-02-10 DIAGNOSIS — R609 Edema, unspecified: Secondary | ICD-10-CM

## 2016-02-10 LAB — BASIC METABOLIC PANEL
BUN: 12 mg/dL (ref 6–23)
CO2: 32 mEq/L (ref 19–32)
Calcium: 9.5 mg/dL (ref 8.4–10.5)
Chloride: 104 mEq/L (ref 96–112)
Creatinine, Ser: 0.8 mg/dL (ref 0.40–1.20)
GFR: 83.05 mL/min (ref >60.00)
Glucose, Bld: 117 mg/dL — ABNORMAL HIGH (ref 70–99)
Potassium: 4.7 mEq/L (ref 3.5–5.1)
Sodium: 140 mEq/L (ref 135–145)

## 2016-02-10 MED ORDER — FUROSEMIDE 20 MG PO TABS: ORAL_TABLET | ORAL | 0 refills | Status: DC

## 2016-02-10 NOTE — Assessment & Plan Note (Signed)
Stable. Continue prn lasix, compression stockings. Obtain follow up bmet to assess renal function and electrolytes.

## 2016-02-10 NOTE — Progress Notes (Signed)
Pre visit review using our clinic review tool, if applicable. No additional management support is needed unless otherwise documented below in the visit note. 

## 2016-02-10 NOTE — Assessment & Plan Note (Signed)
Stable off of meds.  Monitor.  

## 2016-02-10 NOTE — Progress Notes (Signed)
   Subjective:    Patient ID: Marie Ortega, female    DOB: August 15, 1970, 46 y.o.   MRN: [id number]  HPI   Marie Ortega is a 46 yr old female who presents today for follow up. She is requesting a refill on her prn lasix which she uses for PRN edema. She reports that generally her edema is well controlled with her compression stockings and "cold green tea" which she finds to be a natural diuretic. She reports that she rarely needs to use lasix but her old rx was expired.  Depression/anxiety- reports that this is well controlled.    Review of Systems See HPI  Past Medical History:  Diagnosis Date  . Adenomatous colon polyp 12/2008   gets colonoscopies every 3 years.    . Depression   . Dysplastic nevi   . History of chicken pox   . History of telogen effluvium   . Vitamin D deficiency      Social History   Social History  . Marital status: Married    Spouse name: N/A  . Number of children: 2  . Years of education: N/A   Occupational History  . Financial trader   Social History Main Topics  . Smoking status: Never Smoker  . Smokeless tobacco: Never Used  . Alcohol use No  . Drug use: No  . Sexual activity: Not on file   Other Topics Concern  . Not on file   Social History Narrative   Caffeine use: none   Regular exercise:  No          Past Surgical History:  Procedure Laterality Date  . HIP SURGERY Right 07/13/12   Dr [facility]    Family History  Problem Relation Age of Onset  . Colon cancer Mother 70  . Sudden death Neg Hx   . Hypertension Neg Hx   . Hyperlipidemia Neg Hx   . Heart attack Neg Hx   . Diabetes Neg Hx   . Colon polyps Maternal Grandmother     No Known Allergies  Current Outpatient Prescriptions on File Prior to Visit  Medication Sig Dispense Refill  . furosemide (LASIX) 20 MG tablet TAKE 1 TABLET EVERY DAY AS NEEDED 30 tablet 2   No current facility-administered medications on file prior to visit.      BP 112/72 (BP Location: Right Arm, Cuff Size: Normal)   Pulse 81   Temp 98.5 F (36.9 C) (Oral)   Resp 16   Ht 5\' 5"  (1.651 m)   Wt 142 lb 6.4 oz (64.6 kg)   LMP 01/29/2016   SpO2 100% Comment: room air  BMI 23.70 kg/m       Objective:   Physical Exam  Constitutional: She is oriented to person, place, and time. She appears well-developed and well-nourished.  Cardiovascular: Normal rate, regular rhythm and normal heart sounds.   No murmur heard. Pulmonary/Chest: Effort normal and breath sounds normal. No respiratory distress. She has no wheezes.  Musculoskeletal: She exhibits no edema.  Neurological: She is alert and oriented to person, place, and time.  Psychiatric: She has a normal mood and affect. Her behavior is normal. Judgment and thought content normal.          Assessment & Plan:

## 2016-02-10 NOTE — Patient Instructions (Signed)
Please complete lab work prior to leaving.   

## 2016-02-11 ENCOUNTER — Other Ambulatory Visit: Payer: Self-pay | Admitting: Family

## 2016-02-11 DIAGNOSIS — R739 Hyperglycemia, unspecified: Secondary | ICD-10-CM

## 2016-02-12 ENCOUNTER — Other Ambulatory Visit (INDEPENDENT_AMBULATORY_CARE_PROVIDER_SITE_OTHER): Payer: BLUE CROSS/BLUE SHIELD

## 2016-02-12 DIAGNOSIS — R739 Hyperglycemia, unspecified: Secondary | ICD-10-CM

## 2016-02-12 LAB — HEMOGLOBIN A1C: Hgb A1c MFr Bld: 5.2 % (ref 4.6–6.5)

## 2016-02-13 ENCOUNTER — Telehealth: Payer: Self-pay | Admitting: Family

## 2016-02-13 ENCOUNTER — Encounter: Payer: Self-pay | Admitting: Family

## 2016-02-13 NOTE — Telephone Encounter (Signed)
Spoke with pt and advised her of normal hgb a1c (not diabetic) and pt voices understanding.

## 2016-02-13 NOTE — Telephone Encounter (Signed)
°  Relation to [id number] Call back [phone]   Reason for call:  Patient inquiring about lab results and would like to discuss rather then view on mychart, please advise

## 2016-05-11 ENCOUNTER — Ambulatory Visit (INDEPENDENT_AMBULATORY_CARE_PROVIDER_SITE_OTHER): Payer: BLUE CROSS/BLUE SHIELD | Admitting: Family

## 2016-05-11 ENCOUNTER — Encounter: Payer: Self-pay | Admitting: Family

## 2016-05-11 VITALS — BP 96/61 | HR 72 | Temp 98.5°F | Resp 16 | Ht 65.0 in | Wt 138.2 lb

## 2016-05-11 DIAGNOSIS — Z0000 Encounter for general adult medical examination without abnormal findings: Secondary | ICD-10-CM

## 2016-05-11 LAB — HEPATIC FUNCTION PANEL
ALK PHOS: 53 U/L (ref 39–117)
ALT: 13 U/L (ref 0–35)
AST: 16 U/L (ref 0–37)
Albumin: 4 g/dL (ref 3.5–5.2)
Bilirubin, Direct: 0.1 mg/dL (ref 0.0–0.3)
TOTAL PROTEIN: 6.9 g/dL (ref 6.0–8.3)
Total Bilirubin: 0.5 mg/dL (ref 0.2–1.2)

## 2016-05-11 LAB — BASIC METABOLIC PANEL
BUN: 14 mg/dL (ref 6–23)
CALCIUM: 9.5 mg/dL (ref 8.4–10.5)
CHLORIDE: 102 meq/L (ref 96–112)
CO2: 28 mEq/L (ref 19–32)
Creatinine, Ser: 0.76 mg/dL (ref 0.40–1.20)
GFR: 88.01 mL/min (ref >60.00)
Glucose, Bld: 93 mg/dL (ref 70–99)
Potassium: 4.2 mEq/L (ref 3.5–5.1)
Sodium: 136 mEq/L (ref 135–145)

## 2016-05-11 LAB — URINALYSIS, ROUTINE W REFLEX MICROSCOPIC
BILIRUBIN URINE: NEGATIVE
Hgb urine dipstick: NEGATIVE
KETONES UR: NEGATIVE
Leukocytes, UA: NEGATIVE
Nitrite: NEGATIVE
PH: 7 (ref 5.0–8.0)
RBC / HPF: NONE SEEN (ref 0–?)
Specific Gravity, Urine: 1.02 (ref 1.000–1.030)
TOTAL PROTEIN, URINE-UPE24: NEGATIVE
UROBILINOGEN UA: 0.2 (ref 0.0–1.0)
Urine Glucose: NEGATIVE
WBC, UA: NONE SEEN (ref 0–?)

## 2016-05-11 LAB — CBC WITH DIFFERENTIAL/PLATELET
BASOS PCT: 0.3 % (ref 0.0–3.0)
Basophils Absolute: 0 10*3/uL (ref 0.0–0.1)
Eosinophils Absolute: 0 10*3/uL (ref 0.0–0.7)
Eosinophils Relative: 0.4 % (ref 0.0–5.0)
HEMATOCRIT: 39.5 % (ref 36.0–46.0)
HEMOGLOBIN: 13.2 g/dL (ref 12.0–15.0)
Lymphocytes Relative: 24.6 % (ref 12.0–46.0)
Lymphs Abs: 1.5 10*3/uL (ref 0.7–4.0)
MCHC: 33.5 g/dL (ref 30.0–36.0)
MCV: 92.2 fl (ref 78.0–100.0)
Monocytes Absolute: 0.3 10*3/uL (ref 0.1–1.0)
Monocytes Relative: 5.4 % (ref 3.0–12.0)
NEUTROS ABS: 4.2 10*3/uL (ref 1.4–7.7)
Neutrophils Relative %: 69.3 % (ref 43.0–77.0)
Platelets: 238 10*3/uL (ref 150.0–400.0)
RBC: 4.28 Mil/uL (ref 3.87–5.11)
RDW: 13.3 % (ref 11.5–15.5)
WBC: 6.1 10*3/uL (ref 4.0–10.5)

## 2016-05-11 LAB — LIPID PANEL
Cholesterol: 208 mg/dL — ABNORMAL HIGH (ref 0–200)
HDL: 59.1 mg/dL (ref >39.00)
LDL Cholesterol: 123 mg/dL — ABNORMAL HIGH (ref 0–99)
NONHDL: 148.95
Total CHOL/HDL Ratio: 4
Triglycerides: 131 mg/dL (ref 0.0–149.0)
VLDL: 26.2 mg/dL (ref 0.0–40.0)

## 2016-05-11 LAB — TSH: TSH: 0.94 u[IU]/mL (ref 0.35–4.50)

## 2016-05-11 MED ORDER — FUROSEMIDE 20 MG PO TABS: ORAL_TABLET | ORAL | 1 refills | Status: AC

## 2016-05-11 NOTE — Progress Notes (Signed)
Subjective:    Patient ID: KENDREA CERRITOS, female    DOB: 1970-03-01, 46 y.o.   MRN: [id number]  HPI  Patient presents today for complete physical.  Immunizations: tetanus up to date Diet: reports that she feels bloated after eating Exercise: no formal exercise.  Pap Smear: 1/16- normal per pt Mammogram: 1/18- normal per patient Vision: 2016 Dental: up to date  Wt Readings from Last 3 Encounters:  05/11/16 138 lb 3.2 oz (62.7 kg)  02/10/16 142 lb 6.4 oz (64.6 kg)  09/02/14 143 lb 9.6 oz (65.1 kg)      Review of Systems  Constitutional: Negative for unexpected weight change.  HENT: Negative for hearing loss and rhinorrhea.   Eyes: Negative for visual disturbance.  Respiratory: Negative for cough.   Cardiovascular:       Wears compression socks for swelling- stable  Gastrointestinal: Negative for constipation and diarrhea.  Genitourinary: Negative for dysuria and frequency.  Musculoskeletal: Negative for arthralgias and myalgias.  Neurological: Negative for headaches.  Hematological: Negative for adenopathy.  Psychiatric/Behavioral:       Denies depression/anxiety   Past Medical History:  Diagnosis Date  . Adenomatous colon polyp 12/2008   gets colonoscopies every 3 years.    . Depression   . Dysplastic nevi   . History of chicken pox   . History of telogen effluvium   . Vitamin D deficiency      Social History   Social History  . Marital status: Married    Spouse name: N/A  . Number of children: 2  . Years of education: N/A   Occupational History  . Financial trader   Social History Main Topics  . Smoking status: Never Smoker  . Smokeless tobacco: Never Used  . Alcohol use No  . Drug use: No  . Sexual activity: Not on file   Other Topics Concern  . Not on file   Social History Narrative   Caffeine use: none   Regular exercise:  No          Past Surgical History:  Procedure Laterality Date  . HIP SURGERY Right 07/13/12   Dr  Thea [facility]    Family History  Problem Relation Age of Onset  . Colon cancer Mother 80  . Colon polyps Maternal Grandmother   . Sudden death Neg Hx   . Hypertension Neg Hx   . Hyperlipidemia Neg Hx   . Heart attack Neg Hx   . Diabetes Neg Hx     No Known Allergies  Current Outpatient Prescriptions on File Prior to Visit  Medication Sig Dispense Refill  . furosemide (LASIX) 20 MG tablet TAKE 1 TABLET EVERY DAY AS NEEDED 90 tablet 0  . TAYTULLA 1-20 MG-MCG(24) CAPS Take 1 capsule by mouth daily.     No current facility-administered medications on file prior to visit.     BP 96/61 (BP Location: Right Arm, Cuff Size: Normal)   Pulse 72   Temp 98.5 F (36.9 C) (Oral)   Resp 16   Ht 5\' 5"  (1.651 m)   Wt 138 lb 3.2 oz (62.7 kg)   LMP 04/23/2016   SpO2 100% Comment: room air  BMI 23.00 kg/m       Objective:   Physical Exam Physical Exam  Constitutional: She is oriented to person, place, and time. She appears well-developed and well-nourished. No distress.  HENT:  Head: Normocephalic and atraumatic.  Right Ear: Tympanic membrane and ear canal normal.  Left Ear:  Tympanic membrane and ear canal normal.  Mouth/Throat: Oropharynx is clear and moist.  Eyes: Pupils are equal, round, and reactive to light. No scleral icterus.  Neck: Normal range of motion. No thyromegaly present.  Cardiovascular: Normal rate and regular rhythm.   No murmur heard. Pulmonary/Chest: Effort normal and breath sounds normal. No respiratory distress. He has no wheezes. She has no rales. She exhibits no tenderness.  Abdominal: Soft. Bowel sounds are normal. She exhibits no distension and no mass. There is no tenderness. There is no rebound and no guarding.  Musculoskeletal: She exhibits no edema.  Lymphadenopathy:    She has no cervical adenopathy.  Neurological: She is alert and oriented to person, place, and time. She has normal patellar reflexes. She exhibits normal muscle  tone. Coordination normal.  Skin: Skin is warm and dry.  Psychiatric: She has a normal mood and affect. Her behavior is normal. Judgment and thought content normal.  Breasts: Examined lying Right: Without masses, retractions, discharge or axillary adenopathy.  Left: Without masses, retractions, discharge or axillary adenopathy.            Assessment & Plan:          Assessment & Plan:  Preventative Care- she is due for follow up colo and will contact GI to arrange.  Mammo, Pap, immunizations up to date. Discussed increasing exercise. Notes some bloating after certain foods. We discussed briefly eliminating gluten and dair and then adding one back at a time to see if she can correlate one of these foods with her symptoms. EKG tracing is personally reviewed.  EKG notes NSR.  No acute changes.

## 2016-05-11 NOTE — Patient Instructions (Addendum)
Please complete lab work prior to leaving.  Try to add 30 minutes of walking 5 days a week.  

## 2016-05-11 NOTE — Progress Notes (Signed)
Pre visit review using our clinic review tool, if applicable. No additional management support is needed unless otherwise documented below in the visit note. 

## 2016-05-13 ENCOUNTER — Encounter: Payer: Self-pay | Admitting: Family

## 2016-05-15 LAB — VITAMIN D 1,25 DIHYDROXY
VITAMIN D 1, 25 (OH) TOTAL: 86 pg/mL — AB (ref 18–72)
Vitamin D2 1, 25 (OH)2: <8 pg/mL
Vitamin D3 1, 25 (OH)2: 86 pg/mL

## 2016-05-16 NOTE — Telephone Encounter (Signed)
Vit D level is high. Is she taking a vit D supplement? If so, please advise her to d/c vitamin D.

## 2016-06-03 ENCOUNTER — Telehealth: Payer: Self-pay | Admitting: Family

## 2016-06-03 NOTE — Telephone Encounter (Signed)
Caller name: Relationship to patient: Self Can be reached: [phone]  Pharmacy:  Reason for call: Patient called to inform provider that when she took Gluten out of her diet she began to feel 100% better but as she started to add it back her symptoms came back.

## 2016-06-04 NOTE — Telephone Encounter (Signed)
Patient states once she started taking gluten free substitutes she has been experiencing head aches for the last 5 days.best [phone]

## 2016-06-04 NOTE — Telephone Encounter (Signed)
Left detailed message on pt's voicemail re: below and to call if any further questions.

## 2016-06-04 NOTE — Telephone Encounter (Signed)
Spoke with pt. She states she developed headache after eating cookies with gluten free flour. Brain / thinking felt foggy. Make recipe with almond flour and did not get the fogginess but has kept headache. Stopped using that substitute as well. Continues to have headache at the base of her head and beside right ear. Advised pt she needs further evaluation in the office. Pt unable to come in today and per verbal from PCP, ok to wait until Monday. Scheduled appt for Monday at 1:30 pm.

## 2016-06-04 NOTE — Telephone Encounter (Signed)
I would recommend that she try to remain off of gluten if she felt better off gluten.

## 2016-06-07 ENCOUNTER — Encounter: Payer: Self-pay | Admitting: Family

## 2016-06-07 ENCOUNTER — Ambulatory Visit (INDEPENDENT_AMBULATORY_CARE_PROVIDER_SITE_OTHER): Payer: BLUE CROSS/BLUE SHIELD | Admitting: Family

## 2016-06-07 DIAGNOSIS — K9041 Non-celiac gluten sensitivity: Secondary | ICD-10-CM | POA: Diagnosis not present

## 2016-06-07 NOTE — Progress Notes (Signed)
Subjective:    Patient ID: Marie Ortega, female    DOB: 1970/12/29, 46 y.o.   MRN: [id number]  HPI  Marie Ortega is a 46 yr old female who presents today with chief complaint of headache. She reports that since our last visit she has tried to remain gluten free.  Notes that she is no longer having abdominal bloating, her LE edema is improved and even her skin appears healthier.  She reports that she did try to reintroduce some gluten one day and after eating she immediately became "fuzzy headed."  She reports that she has had some headaches since discontinuing gluten however.    Review of Systems See HPI  Past Medical History:  Diagnosis Date  . Adenomatous colon polyp 12/2008   gets colonoscopies every 3 years.    . Depression   . Dysplastic nevi   . History of chicken pox   . History of telogen effluvium   . Vitamin D deficiency      Social History   Social History  . Marital status: Married    Spouse name: N/A  . Number of children: 2  . Years of education: N/A   Occupational History  . Financial trader   Social History Main Topics  . Smoking status: Never Smoker  . Smokeless tobacco: Never Used  . Alcohol use No  . Drug use: No  . Sexual activity: No   Other Topics Concern  . Not on file   Social History Narrative   Caffeine use: none   Regular exercise:  No   2 children son Murrell Redden and daughter sarah 2007   Works as a English as a second language teacher   Married   No pets    Past Surgical History:  Procedure Laterality Date  . HIP SURGERY Right 07/13/12   Dr Thea [facility]    Family History  Problem Relation Age of Onset  . Colon cancer Mother 64  . Colon polyps Maternal Grandmother   . Sudden death Neg Hx   . Hypertension Neg Hx   . Hyperlipidemia Neg Hx   . Heart attack Neg Hx   . Diabetes Neg Hx     No Known Allergies  Current Outpatient Prescriptions on File Prior to Visit  Medication Sig Dispense Refill  . furosemide (LASIX) 20  MG tablet TAKE 1 TABLET EVERY DAY AS NEEDED 90 tablet 1  . TAYTULLA 1-20 MG-MCG(24) CAPS Take 1 capsule by mouth daily.     No current facility-administered medications on file prior to visit.     BP 113/60 (BP Location: Right Arm, Cuff Size: Normal)   Pulse 65   Temp 98.7 F (37.1 C) (Oral)   Resp 16   Ht 5\' 5"  (1.025 m)   Wt 138 lb (62.6 kg)   LMP 05/17/2016 (Exact Date)   SpO2 100%   BMI 22.96 kg/m       Objective:   Physical Exam  Constitutional: She is oriented to person, place, and time. She appears well-developed and well-nourished.  Cardiovascular: Normal rate, regular rhythm and normal heart sounds.   No murmur heard. Pulmonary/Chest: Effort normal and breath sounds normal. No respiratory distress. She has no wheezes.  Musculoskeletal: She exhibits no edema.  Neurological: She is alert and oriented to person, place, and time.  Skin: Skin is warm and dry.  Psychiatric: She has a normal mood and affect. Her behavior is normal. Judgment and thought content normal.  Assessment & Plan:

## 2016-06-07 NOTE — Patient Instructions (Signed)
Please continue gluten free diet.  Call if you develop recurrent headaches.

## 2016-06-10 DIAGNOSIS — K9041 Non-celiac gluten sensitivity: Secondary | ICD-10-CM | POA: Insufficient documentation

## 2016-06-10 NOTE — Assessment & Plan Note (Signed)
I suspect that she has not been taking in enough carbs since she has been off of gluten.  We discussed ensuring that she has adequate carbs with each meal (rice, corn, potatoes).  She will let me know if her headaches do not improve.  15 min spent wit pt today.  >50% of this time was spent counseling patient on diet.

## 2016-07-28 NOTE — Telephone Encounter (Signed)
Above [employer] returned as unread. Mailed letter to pt.

## 2016-11-10 ENCOUNTER — Ambulatory Visit (INDEPENDENT_AMBULATORY_CARE_PROVIDER_SITE_OTHER): Payer: BLUE CROSS/BLUE SHIELD | Admitting: Family

## 2016-11-10 ENCOUNTER — Encounter: Payer: Self-pay | Admitting: Family

## 2016-11-10 VITALS — BP 101/50 | HR 90 | Temp 98.4°F | Resp 16 | Ht 65.0 in | Wt 137.0 lb

## 2016-11-10 DIAGNOSIS — R609 Edema, unspecified: Secondary | ICD-10-CM

## 2016-11-10 DIAGNOSIS — R002 Palpitations: Secondary | ICD-10-CM

## 2016-11-10 LAB — CBC WITH DIFFERENTIAL/PLATELET
BASOS ABS: 0 10*3/uL (ref 0.0–0.1)
Basophils Relative: 0.6 % (ref 0.0–3.0)
Eosinophils Absolute: 0 10*3/uL (ref 0.0–0.7)
Eosinophils Relative: 0.5 % (ref 0.0–5.0)
HCT: 42.1 % (ref 36.0–46.0)
Hemoglobin: 14 g/dL (ref 12.0–15.0)
LYMPHS PCT: 27 % (ref 12.0–46.0)
Lymphs Abs: 1.9 10*3/uL (ref 0.7–4.0)
MCHC: 33.2 g/dL (ref 30.0–36.0)
MCV: 93.9 fl (ref 78.0–100.0)
MONOS PCT: 6.7 % (ref 3.0–12.0)
Monocytes Absolute: 0.5 10*3/uL (ref 0.1–1.0)
Neutro Abs: 4.5 10*3/uL (ref 1.4–7.7)
Neutrophils Relative %: 65.2 % (ref 43.0–77.0)
PLATELETS: 241 10*3/uL (ref 150.0–400.0)
RBC: 4.48 Mil/uL (ref 3.87–5.11)
RDW: 12.7 % (ref 11.5–15.5)
WBC: 6.9 10*3/uL (ref 4.0–10.5)

## 2016-11-10 LAB — BASIC METABOLIC PANEL
BUN: 15 mg/dL (ref 6–23)
CO2: 32 mEq/L (ref 19–32)
Calcium: 10 mg/dL (ref 8.4–10.5)
Chloride: 100 mEq/L (ref 96–112)
Creatinine, Ser: 0.77 mg/dL (ref 0.40–1.20)
GFR: 86.49 mL/min (ref >60.00)
Glucose, Bld: 90 mg/dL (ref 70–99)
Potassium: 4.4 mEq/L (ref 3.5–5.1)
SODIUM: 140 meq/L (ref 135–145)

## 2016-11-10 LAB — TSH: TSH: 1.39 u[IU]/mL (ref 0.35–4.50)

## 2016-11-10 NOTE — Assessment & Plan Note (Signed)
Improved on a gluten-free diet.  She notes that she tends to swell more after she eats high sodium snacks such as chips.  We discussed low-sodium diet.  Continue Lasix on an as-needed basis.  Will obtain follow-up basic metabolic panel.

## 2016-11-10 NOTE — Progress Notes (Signed)
Subjective:    Patient ID: Marie Ortega, female    DOB: February 22, 1970, 46 y.o.   MRN: [id number]  HPI  Pt is a 46 yr old female who presents today for follow up.  Edema-patient is maintained on Lasix as needed.  Reports that she does not use it every day. Has some swelling after she eats chips.  Reports that she wears support stockings.  Swelling is improved.  She feels that the greatest factor in her improved swelling is removing gluten from her diet.  She reports intermittent episodes of palpitations.  Reports minimal caffeine intake.   Review of Systems See HPI  Past Medical History:  Diagnosis Date  . Adenomatous colon polyp 12/2008   gets colonoscopies every 3 years.    . Depression   . Dysplastic nevi   . History of chicken pox   . History of telogen effluvium   . Vitamin D deficiency      Social History   Social History  . Marital status: Married    Spouse name: N/A  . Number of children: 2  . Years of education: N/A   Occupational History  . Financial trader   Social History Main Topics  . Smoking status: Never Smoker  . Smokeless tobacco: Never Used  . Alcohol use No  . Drug use: No  . Sexual activity: No   Other Topics Concern  . Not on file   Social History Narrative   Caffeine use: none   Regular exercise:  No   2 children son Murrell Redden and daughter sarah 2007   Works as a English as a second language teacher   Married   No pets    Past Surgical History:  Procedure Laterality Date  . HIP SURGERY Right 07/13/12   Dr [facility]a [facility]    Family History  Problem Relation Age of Onset  . Colon cancer Mother 38  . Colon polyps Maternal Grandmother   . Sudden death Neg Hx   . Hypertension Neg Hx   . Hyperlipidemia Neg Hx   . Heart attack Neg Hx   . Diabetes Neg Hx     No Known Allergies  Current Outpatient Prescriptions on File Prior to Visit  Medication Sig Dispense Refill  . furosemide (LASIX) 20 MG tablet TAKE 1 TABLET EVERY DAY AS  NEEDED 90 tablet 1  . TAYTULLA 1-20 MG-MCG(24) CAPS Take 1 capsule by mouth daily.     No current facility-administered medications on file prior to visit.     BP (!) 101/50 (BP Location: Left Arm, Cuff Size: Normal)   Pulse 90   Temp 98.4 F (36.9 C) (Oral)   Resp 16   Ht 5\' 5"  (1.651 m)   Wt 137 lb (62.1 kg)   LMP 11/04/2016   SpO2 100%   BMI 22.80 kg/m       Objective:   Physical Exam  Constitutional: She is oriented to person, place, and time. She appears well-developed and well-nourished.  Cardiovascular: Normal rate, regular rhythm and normal heart sounds.   No murmur heard. Pulmonary/Chest: Effort normal and breath sounds normal. No respiratory distress. She has no wheezes.  Musculoskeletal: She exhibits no edema.  Neurological: She is alert and oriented to person, place, and time.  Psychiatric: She has a normal mood and affect. Her behavior is normal. Judgment and thought content normal.          Assessment & Plan:  Palpitations-EKG tracing is personally reviewed.  EKG notes NSR.  No acute  changes.  Will obtain TSH, CBC, and basic metabolic panel to further evaluate.  Advised patient if ongoing issues with palpitations to let me know and I will proceed with ordering a Holter monitor.

## 2016-11-10 NOTE — Patient Instructions (Signed)
Please complete lab work prior to leaving. Call if you develop increased frequency of heart palpitations and we can order a holter monitor

## 2017-05-09 ENCOUNTER — Encounter: Payer: Self-pay | Admitting: Medical

## 2017-05-09 ENCOUNTER — Ambulatory Visit (HOSPITAL_BASED_OUTPATIENT_CLINIC_OR_DEPARTMENT_OTHER)
Admission: RE | Admit: 2017-05-09 | Discharge: 2017-05-09 | Disposition: A | Discharge status: Home / Self Care | Payer: BLUE CROSS/BLUE SHIELD | Source: Ambulatory Visit | Attending: Medical | Admitting: Medical

## 2017-05-09 ENCOUNTER — Ambulatory Visit: Payer: BLUE CROSS/BLUE SHIELD | Admitting: Medical

## 2017-05-09 VITALS — BP 112/57 | HR 71 | Temp 98.2°F | Resp 16 | Ht 65.0 in | Wt 145.0 lb

## 2017-05-09 DIAGNOSIS — R059 Cough, unspecified: Secondary | ICD-10-CM

## 2017-05-09 DIAGNOSIS — M940 Chondrocostal junction syndrome [Tietze]: Secondary | ICD-10-CM | POA: Diagnosis not present

## 2017-05-09 DIAGNOSIS — R0781 Pleurodynia: Secondary | ICD-10-CM

## 2017-05-09 DIAGNOSIS — R05 Cough: Secondary | ICD-10-CM | POA: Insufficient documentation

## 2017-05-09 DIAGNOSIS — R0789 Other chest pain: Secondary | ICD-10-CM

## 2017-05-09 MED ORDER — MELOXICAM 7.5 MG PO TABS: ORAL_TABLET | ORAL | 0 refills | Status: DC

## 2017-05-09 NOTE — Progress Notes (Signed)
Subjective:    Patient ID: Marie Ortega, female    DOB: 1970/09/11, 47 y.o.   MRN: [id number]  HPI  Pt in states she has had some chest congestion and larynghitis from April 5-12 th. She had a lot of cough around that time. Pt states on the 12 th 3:30 pm had massive cough for 5 minutes or more. After cough episode she note rt side upper rib pain.   All last week some rib only when she coughed.   By April 15th cough mostly resolved.  On April 11 th she had one day of sinus pressure until around April 13th.But no sinus pain since.  Over weekend chest wall sore. Pain worse on deep breathig. Rare cough now but if she coughs now extreme pain when she coughs.  Pt states has tried ibuprofen. But only 200 mg dose.   Review of Systems  Constitutional: Negative for chills, fatigue and fever.  HENT: Negative for congestion, hearing loss, mouth sores, postnasal drip and rhinorrhea.   Respiratory: Positive for cough. Negative for chest tightness, shortness of breath and wheezing.        Very rare minimal cough now.  Almost completely resolved  Cardiovascular: Negative for chest pain and palpitations.       Pain very much costochondral like.  Musculoskeletal: Negative for back pain.       Right side costochondral junction area pain and right rib pain.  Pain worse with moving thorax.  Very severe coughing or sneezing.  Worst pain is in the right rib area.  Hematological: Negative for adenopathy. Does not bruise/bleed easily.  Psychiatric/Behavioral: Negative for behavioral problems, confusion and dysphoric mood. The patient is not nervous/anxious and is not hyperactive.     Past Medical History:  Diagnosis Date  . Adenomatous colon polyp 12/2008   gets colonoscopies every 3 years.    . Depression   . Dysplastic nevi   . History of chicken pox   . History of telogen effluvium   . Vitamin D deficiency      Social History   Socioeconomic History  . Marital status: Married   Spouse name: Not on file  . Number of children: 2  . Years of education: Not on file  . Highest education level: Not on file  Occupational History  . Occupation: Training and development officer: [employer]  Social Needs  . Financial resource strain: Not on file  . Food insecurity:    Worry: Not on file    Inability: Not on file  . Transportation needs:    Medical: Not on file    Non-medical: Not on file  Tobacco Use  . Smoking status: Never Smoker  . Smokeless tobacco: Never Used  Substance and Sexual Activity  . Alcohol use: No  . Drug use: No  . Sexual activity: Never  Lifestyle  . Physical activity:    Days per week: Not on file    Minutes per session: Not on file  . Stress: Not on file  Relationships  . Social connections:    Talks on phone: Not on file    Gets together: Not on file    Attends religious service: Not on file    Active member of club or organization: Not on file    Attends meetings of clubs or organizations: Not on file    Relationship status: Not on file  . Intimate partner violence:    Fear of current or ex partner: Not on file  Emotionally abused: Not on file    Physically abused: Not on file    Forced sexual activity: Not on file  Other Topics Concern  . Not on file  Social History Narrative   Caffeine use: none   Regular exercise:  No   2 children son Murrell Redden and daughter sarah 2007   Works as a English as a second language teacher   Married   No pets    Past Surgical History:  Procedure Laterality Date  . HIP SURGERY Right 07/13/12   Dr Thea Silversmith [facility]    Family History  Problem Relation Age of Onset  . Colon cancer Mother 51  . Colon polyps Maternal Grandmother   . Sudden death Neg Hx   . Hypertension Neg Hx   . Hyperlipidemia Neg Hx   . Heart attack Neg Hx   . Diabetes Neg Hx     No Known Allergies  Current Outpatient Medications on File Prior to Visit  Medication Sig Dispense Refill  . furosemide (LASIX) 20 MG tablet TAKE 1 TABLET  EVERY DAY AS NEEDED 90 tablet 1  . TAYTULLA 1-20 MG-MCG(24) CAPS Take 1 capsule by mouth daily.     No current facility-administered medications on file prior to visit.     BP (!) 112/57   Pulse 71   Temp 98.2 F (36.8 C) (Oral)   Resp 16   Ht 5\' 5"  (1.651 m)   Wt 145 lb (65.8 kg)   SpO2 100%   BMI 24.13 kg/m      Objective:   Physical Exam  General  Mental Status - Alert. General Appearance - Well groomed. Not in acute distress.  Skin Rashes- No Rashes.  HEENT Head- Normal. Ear Auditory Canal - Left- Normal. Right - Normal.Tympanic Membrane- Left- Normal. Right- Normal. Eye Sclera/Conjunctiva- Left- Normal. Right- Normal. Nose & Sinuses Nasal Mucosa- Left-   not boggy and Congested. Right-   not boggy and  Congested.Bilateral  No maxillary and no  frontal sinus pressure. Mouth & Throat Lips: Upper Lip- Normal: no dryness, cracking, pallor, cyanosis, or vesicular eruption. Lower Lip-Normal: no dryness, cracking, pallor, cyanosis or vesicular eruption. Buccal Mucosa- Bilateral- No Aphthous ulcers. Oropharynx- No Discharge or Erythema. Tonsils: Characteristics- Bilateral- No Erythema or Congestion. Size/Enlargement- Bilateral- No enlargement. Discharge- bilateral-None.  Neck Neck- Supple. No Masses.   Chest and Lung Exam Auscultation: Breath Sounds:-Clear even and unlabored.  Cardiovascular Auscultation:Rythm- Regular, rate and rhythm. Murmurs & Other Heart Sounds:Ausculatation of the heart reveal- No Murmurs.  Lymphatic Head & Neck General Head & Neck Lymphatics: Bilateral: Description- No Localized lymphadenopathy.  Anterior thorax- upon palpation the right costochondral junction pain is mild and can be reproduced.  No left-sided costochondral junction tenderness to palpation Right ribs-upon palpation of the ribs under the right axillary region pain is moderate.      Assessment & Plan:  You do have recent rib pain and costochondral junction pain that  appears to follow moderate to severe cough recently.  Will prescribe meloxicam to take 1 to 2 tablets daily.  Recommend not to take any over-the-counter NSAIDs while on meloxicam.  Based on severe/high intensity right upper rib region pain after cough, I do think it would be beneficial to get x-ray of chest and ribs.  EKG done today since you did report some faint achiness over the past week even without taking deep breaths.  Although, your presentation is very much costochondral light but for safety sake decided to do EKG.  On review sinus rhythm. Same  as prior ekg on comparison. Except some artifact on v3 lead  Follow-up in 7 to 10 days or as needed.

## 2017-05-09 NOTE — Progress Notes (Signed)
e

## 2017-05-09 NOTE — Patient Instructions (Addendum)
You do have recent rib pain and costochondral junction pain that appears to follow moderate to severe cough recently.  Will prescribe meloxicam to take 1 to 2 tablets daily.  Recommend not to take any over-the-counter NSAIDs while on meloxicam.  Based on severe/high intensity right upper rib region pain after cough, I do think it would be beneficial to get x-ray of chest and ribs.  EKG done today since you did report some faint achiness over the past week even without taking deep breaths.  Although, your presentation is very much costochondral light but for safety sake decided to do EKG.  On review sinus rhythm. Same as prior ekg on comparison. Except some artifact on v3 lead  Follow-up in 7 to 10 days or as needed.

## 2017-06-24 ENCOUNTER — Ambulatory Visit: Payer: BLUE CROSS/BLUE SHIELD | Admitting: Physician Assistant

## 2017-06-24 ENCOUNTER — Encounter: Payer: Self-pay | Admitting: Physician Assistant

## 2017-06-24 ENCOUNTER — Encounter

## 2017-06-24 VITALS — BP 98/62 | HR 72 | Ht 65.0 in | Wt 144.6 lb

## 2017-06-24 DIAGNOSIS — Z86010 Personal history of colonic polyps: Secondary | ICD-10-CM

## 2017-06-24 DIAGNOSIS — R1314 Dysphagia, pharyngoesophageal phase: Secondary | ICD-10-CM

## 2017-06-24 DIAGNOSIS — R140 Abdominal distension (gaseous): Secondary | ICD-10-CM | POA: Diagnosis not present

## 2017-06-24 DIAGNOSIS — Z800 Family history of malignant neoplasm of digestive organs: Secondary | ICD-10-CM

## 2017-06-24 MED ORDER — NA SULFATE-K SULFATE-MG SULF 17.5-3.13-1.6 GM/177ML PO SOLN: ORAL | 0 refills | Status: DC

## 2017-06-24 NOTE — Progress Notes (Signed)
Reviewed and agree with management plan.  Cherilynn Schomburg T. Fredonia Casalino, MD FACG 

## 2017-06-24 NOTE — Patient Instructions (Signed)
If you are age 45 or older, your body mass index should be between 23-30. Your Body mass index is 24.06 kg/m. If this is out of the aforementioned range listed, please consider follow up with your Primary Care Provider.  If you are age 45 or younger, your body mass index should be between 19-25. Your Body mass index is 24.06 kg/m. If this is out of the aformentioned range listed, please consider follow up with your Primary Care Provider.   You have been scheduled for an endoscopy and colonoscopy. Please follow the written instructions given to you at your visit today. Please pick up your prep supplies at the pharmacy within the next 1-3 days. If you use inhalers (even only as needed), please bring them with you on the day of your procedure. Your physician has requested that you go to www.startemmi.com and enter the access code given to you at your visit today. This web site gives a general overview about your procedure. However, you should still follow specific instructions given to you by our office regarding your preparation for the procedure.  We have sent the following medications to your pharmacy for you to pick up at your convenience: [place] have been scheduled for a Barium Esophogram at [facility] (1st floor of the hospital) on 07/06/17   at 9 am. Please arrive 15 minutes prior to your appointment for registration. Make certain not to have anything to eat or drink 3 hours prior to your test. If you need to reschedule for any reason, please contact radiology at [phone] to do so. __________________________________________________________________ A barium swallow is an examination that concentrates on views of the esophagus. This tends to be a double contrast exam (barium and two liquids which, when combined, create a gas to distend the wall of the oesophagus) or single contrast (non-ionic iodine based). The study is usually tailored to your symptoms so a good history is  essential. Attention is paid during the study to the form, structure and configuration of the esophagus, looking for functional disorders (such as aspiration, dysphagia, achalasia, motility and reflux) EXAMINATION You may be asked to change into a gown, depending on the type of swallow being performed. A radiologist and radiographer will perform the procedure. The radiologist will advise you of the type of contrast selected for your procedure and direct you during the exam. You will be asked to stand, sit or lie in several different positions and to hold a small amount of fluid in your mouth before being asked to swallow while the imaging is performed .In some instances you may be asked to swallow barium coated marshmallows to assess the motility of a solid food bolus. The exam can be recorded as a digital or video fluoroscopy procedure. POST PROCEDURE It will take 1-2 days for the barium to pass through your system. To facilitate this, it is important, unless otherwise directed, to increase your fluids for the next 24-48hrs and to resume your normal diet.  This test typically takes about 30 minutes to perform. _____________________________________________________________________________You have been given samples of Align and coupons.  Take for 2-3 months.  You have been given a High Fiber Diet handout.  Thank you for choosing me and [facility].   Ellouise Newer, PA-C

## 2017-06-24 NOTE — Progress Notes (Signed)
Chief Complaint: Bloating, dysphagia and constipation  HPI:    Marie Ortega is a 47 year old female, known to Dr. Fuller Plan, with a past medical history as listed below including adenomatous colon polyps and depression as well as family history of colon cancer, who was referred to me by Debbrah Alar, NP for a complaint of bloating, dysphagia and constipation.      02/04/2012 colonoscopy Dr. Fuller Plan, normal.  Repeat recommended in 3 years.    10/03/2012 office visit Dr. Fuller Plan to discuss constipation.  Instructed to use MiraLAX 1-3 times daily.  Also discussed daily probiotic and fiber increase.    05/09/17 office visit with PCP diagnosed with costochondritis, start on Meloxicam 1-2 tabs daily.    Today, explains that about a year ago she was experiencing severe bloating and saw her primary care provider who recommended she go gluten-free.  Was also having some leg swelling and other various complaints.  After going gluten-free patient tells me her leg swelling went away and overall she felt better with less brain fog and less bloating.  Does discuss that she feels slightly increased bloating over the past couple of months though and is unsure why this is.    Biggest complaint today is of dysphagia explaining that since March she has noticed that she would spit more often when she was "hot and working outside".  She realized she was doing this as it is easier to spit then try and swallow down her saliva.  Now it seems as though everything is hard to swallow including food and liquid.  Towards the end of the day her throat feels somewhat sore.  Occasional reflux symptoms once every couple of weeks.    Describes trouble with bowel movements for years, typically small balls, does eat prunes occasionally which helps "a little bit".  Has never tried anything else.    Denies fever, chills, blood in her stool, weight loss, anorexia, nausea or vomiting.  Past Medical History:  Diagnosis Date  . Adenomatous  colon polyp 12/2008   gets colonoscopies every 3 years.    . Depression   . Dysplastic nevi   . History of chicken pox   . History of telogen effluvium   . Vitamin D deficiency     Past Surgical History:  Procedure Laterality Date  . HIP SURGERY Right 07/13/12   Dr Thea Silversmith [facility]    Current Outpatient Medications  Medication Sig Dispense Refill  . furosemide (LASIX) 20 MG tablet TAKE 1 TABLET EVERY DAY AS NEEDED 90 tablet 1  . loratadine (CLARITIN) 10 MG tablet Take 10 mg by mouth daily.    . Minoxidil 5 % FOAM Apply topically daily.    . TAYTULLA 1-20 MG-MCG(24) CAPS Take 1 capsule by mouth daily.     No current facility-administered medications for this visit.     Allergies as of 06/24/2017  . (No Known Allergies)    Family History  Problem Relation Age of Onset  . Colon cancer Mother 14  . Colon polyps Maternal Grandmother   . Sudden death Neg Hx   . Hypertension Neg Hx   . Hyperlipidemia Neg Hx   . Heart attack Neg Hx   . Diabetes Neg Hx     Social History   Socioeconomic History  . Marital status: Married    Spouse name: Not on file  . Number of children: 2  . Years of education: Not on file  . Highest education level: Not on file  Occupational  History  . Occupation: Training and development officer: [employer]  Social Needs  . Financial resource strain: Not on file  . Food insecurity:    Worry: Not on file    Inability: Not on file  . Transportation needs:    Medical: Not on file    Non-medical: Not on file  Tobacco Use  . Smoking status: Never Smoker  . Smokeless tobacco: Never Used  Substance and Sexual Activity  . Alcohol use: No  . Drug use: No  . Sexual activity: Never  Lifestyle  . Physical activity:    Days per week: Not on file    Minutes per session: Not on file  . Stress: Not on file  Relationships  . Social connections:    Talks on phone: Not on file    Gets together: Not on file    Attends religious service: Not on  file    Active member of club or organization: Not on file    Attends meetings of clubs or organizations: Not on file    Relationship status: Not on file  . Intimate partner violence:    Fear of current or ex partner: Not on file    Emotionally abused: Not on file    Physically abused: Not on file    Forced sexual activity: Not on file  Other Topics Concern  . Not on file  Social History Narrative   Caffeine use: none   Regular exercise:  No   2 children son Marie Ortega 2004 and daughter Marie Ortega 2007   Works as a English as a second language teacher   Married   No pets    Review of Systems:    Constitutional: No weight loss, fever or chills Skin: No rash  Cardiovascular: No chest pain Respiratory: No SOB  Gastrointestinal: See HPI and otherwise negative Genitourinary: No dysuria Neurological: No headache, dizziness or syncope Musculoskeletal: No new muscle or joint pain Hematologic: No bleeding  Psychiatric: No history of depression or anxiety   Physical Exam:  Vital signs: BP 98/62   Pulse 72   Ht 5\' 5"  (1.651 m)   Wt 144 lb 9.6 oz (65.6 kg)   LMP 06/17/2017   BMI 24.06 kg/m   Constitutional:   Pleasant Caucasian female appears to be in NAD, Well developed, Well nourished, alert and cooperative Head:  Normocephalic and atraumatic. Eyes:   PEERL, EOMI. No icterus. Conjunctiva pink. Ears:  Normal auditory acuity. Neck:  Supple Throat: Oral cavity and pharynx without inflammation, swelling or lesion.  Respiratory: Respirations even and unlabored. Lungs clear to auscultation bilaterally.   No wheezes, crackles, or rhonchi.  Cardiovascular: Normal S1, S2. No MRG. Regular rate and rhythm. No peripheral edema, cyanosis or pallor.  Gastrointestinal:  Soft, nondistended, nontender. No rebound or guarding. Normal bowel sounds. No appreciable masses or hepatomegaly. Rectal:  Not performed.  Msk:  Symmetrical without gross deformities. Without edema, no deformity or joint abnormality.  Neurologic:  Alert and   oriented x4;  grossly normal neurologically.  Skin:   Dry and intact without significant lesions or rashes. Psychiatric: Demonstrates good judgement and reason without abnormal affect or behaviors.  No recent labs or imaging.  Assessment: 1.  History of adenomatous colon polyps: Last colonoscopy 2014 with recommendations to repeat in 3 years, patient is overdue 2.  Family history of colon cancer 3.  Dysphagia: Worsening over the past 3 months, intolerance to liquids and solids; consider esophageal stricture versus other 4.  Bloating: Consider relation to suspected gluten intolerance +/- IBS +/-  SIBO  Plan: 1.  Scheduled patient for an EGD likely with dilation and surveillance colonoscopy due to family history of colon cancer and personal history of adenomatous polyps in [place] with Dr. Fuller Plan.  Did discuss risk, benefits, limitations alternatives the patient agrees to proceed. 2.  Ordered barium esophagram with tablet.  Pending results may cancel EGD.  Patient requested [facility] be [facility] at [facility]. 3.  Would recommend patient remain on gluten-free diet as this seems to help her overall. 4.  Recommended increase in fiber in her diet to 25 -35 g/day with use of a fiber supplement and/or through her diet. 5.  Would recommend patient start a daily probiotic such as Align.  Provided her with samples and a coupon.  Recommend she continue this for at least 3 months. 6.   Patient to follow in clinic per recommendations from Dr. Fuller Plan after time of procedure.  Ellouise Newer, PA-C [facility] 06/24/2017, 11:15 AM  Cc: Debbrah Alar, NP

## 2017-06-29 ENCOUNTER — Ambulatory Visit: Payer: BLUE CROSS/BLUE SHIELD | Admitting: Gastroenterology

## 2017-06-29 ENCOUNTER — Telehealth: Payer: Self-pay | Admitting: Physician Assistant

## 2017-06-29 NOTE — Telephone Encounter (Signed)
Patient wanting to know if she can start her prep the night before her procedure 6.24.19 at 8:15pm instead of 6pm due to prior planned church function. Pt had ov on 6.7.19

## 2017-06-30 ENCOUNTER — Encounter: Payer: Self-pay | Admitting: Gastroenterology

## 2017-06-30 NOTE — Telephone Encounter (Signed)
Spoke to patient and informed her that would be fine. She verbalized understanding.

## 2017-07-06 ENCOUNTER — Ambulatory Visit (HOSPITAL_COMMUNITY)
Admission: RE | Admit: 2017-07-06 | Discharge: 2017-07-06 | Disposition: A | Discharge status: Home / Self Care | Payer: BLUE CROSS/BLUE SHIELD | Source: Ambulatory Visit | Attending: Physician Assistant | Admitting: Physician Assistant

## 2017-07-06 DIAGNOSIS — K224 Dyskinesia of esophagus: Secondary | ICD-10-CM | POA: Insufficient documentation

## 2017-07-06 DIAGNOSIS — R1314 Dysphagia, pharyngoesophageal phase: Secondary | ICD-10-CM | POA: Diagnosis not present

## 2017-07-12 ENCOUNTER — Encounter: Payer: Self-pay | Admitting: Gastroenterology

## 2017-07-12 ENCOUNTER — Ambulatory Visit (AMBULATORY_SURGERY_CENTER): Payer: BLUE CROSS/BLUE SHIELD | Admitting: Gastroenterology

## 2017-07-12 ENCOUNTER — Other Ambulatory Visit: Payer: Self-pay

## 2017-07-12 VITALS — BP 105/50 | HR 71 | Temp 98.9°F | Resp 17 | Ht 65.0 in | Wt 144.0 lb

## 2017-07-12 DIAGNOSIS — Z86010 Personal history of colonic polyps: Secondary | ICD-10-CM | POA: Diagnosis not present

## 2017-07-12 DIAGNOSIS — R1310 Dysphagia, unspecified: Secondary | ICD-10-CM | POA: Diagnosis not present

## 2017-07-12 DIAGNOSIS — Z800 Family history of malignant neoplasm of digestive organs: Secondary | ICD-10-CM

## 2017-07-12 DIAGNOSIS — D123 Benign neoplasm of transverse colon: Secondary | ICD-10-CM | POA: Diagnosis not present

## 2017-07-12 DIAGNOSIS — Z860101 Personal history of adenomatous and serrated colon polyps: Secondary | ICD-10-CM

## 2017-07-12 MED ORDER — PANTOPRAZOLE SODIUM 40 MG PO TBEC: 40.0000 mg | DELAYED_RELEASE_TABLET | Freq: Every day | ORAL | 2 refills | Status: DC

## 2017-07-12 MED ORDER — PANTOPRAZOLE SODIUM 40 MG PO TBEC: 40.0000 mg | DELAYED_RELEASE_TABLET | ORAL | 2 refills | Status: DC

## 2017-07-12 MED ORDER — SODIUM CHLORIDE 0.9 % IV SOLN
500.0000 mL | INTRAVENOUS | Status: DC
Start: 2017-07-12 — End: 2020-12-09

## 2017-07-12 NOTE — Progress Notes (Signed)
Report to PACU, RN, vss, BBS= Clear.  

## 2017-07-12 NOTE — Progress Notes (Signed)
Called to room to assist during endoscopic procedure.  Patient ID and intended procedure confirmed with present staff. Received instructions for my participation in the procedure from the performing physician.  

## 2017-07-12 NOTE — Patient Instructions (Signed)
Discharge instructions given. Handouts on polyps,and a dilatation diet. Prescription sent to pharmacy. Resume previous medications. YOU HAD AN ENDOSCOPIC PROCEDURE TODAY AT [facility]:   Refer to the procedure report that was given to you for any specific questions about what was found during the examination.  If the procedure report does not answer your questions, please call your gastroenterologist to clarify.  If you requested that your care partner not be given the details of your procedure findings, then the procedure report has been included in a sealed envelope for you to review at your convenience later.  YOU SHOULD EXPECT: Some feelings of bloating in the abdomen. Passage of more gas than usual.  Walking can help get rid of the air that was put into your GI tract during the procedure and reduce the bloating. If you had a lower endoscopy (such as a colonoscopy or flexible sigmoidoscopy) you may notice spotting of blood in your stool or on the toilet paper. If you underwent a bowel prep for your procedure, you may not have a normal bowel movement for a few days.  Please Note:  You might notice some irritation and congestion in your nose or some drainage.  This is from the oxygen used during your procedure.  There is no need for concern and it should clear up in a day or so.  SYMPTOMS TO REPORT IMMEDIATELY:   Following lower endoscopy (colonoscopy or flexible sigmoidoscopy):  Excessive amounts of blood in the stool  Significant tenderness or worsening of abdominal pains  Swelling of the abdomen that is new, acute  Fever of 100F or higher   Following upper endoscopy (EGD)  Vomiting of blood or coffee ground material  New chest pain or pain under the shoulder blades  Painful or persistently difficult swallowing  New shortness of breath  Fever of 100F or higher  Black, tarry-looking stools  For urgent or emergent issues, a gastroenterologist can be reached at any  hour by calling [phone].   DIET:  We do recommend a small meal at first, but then you may proceed to your regular diet.  Drink plenty of fluids but you should avoid alcoholic beverages for 24 hours.  ACTIVITY:  You should plan to take it easy for the rest of today and you should NOT DRIVE or use heavy machinery until tomorrow (because of the sedation medicines used during the test).    FOLLOW UP: Our staff will call the number listed on your records the next business day following your procedure to check on you and address any questions or concerns that you may have regarding the information given to you following your procedure. If we do not reach you, we will leave a message.  However, if you are feeling well and you are not experiencing any problems, there is no need to return our call.  We will assume that you have returned to your regular daily activities without incident.  If any biopsies were taken you will be contacted by phone or by letter within the next 1-3 weeks.  Please call us at [phone] if you have not heard about the biopsies in 3 weeks.    SIGNATURES/CONFIDENTIALITY: You and/or your care partner have signed paperwork which will be entered into your electronic medical record.  These signatures attest to the fact that that the information above on your After Visit Summary has been reviewed and is understood.  Full responsibility of the confidentiality of this discharge information lies with  you and/or your care-partner.

## 2017-07-12 NOTE — Op Note (Signed)
[facility] Patient Name: Marie Ortega Procedure Date: 07/12/2017 2:37 PM MRN: [id number] Endoscopist: Ladene Artist , MD Age: 46 Referring MD:  Date of Birth: 10-Jan-1971 Gender: Female Account #: [id number] Procedure:                Upper GI endoscopy Indications:              Dysphagia Medicines:                Monitored Anesthesia Care Procedure:                Pre-Anesthesia Assessment:                           - Prior to the procedure, a History and Physical                            was performed, and patient medications and                            allergies were reviewed. The patient's tolerance of                            previous anesthesia was also reviewed. The risks                            and benefits of the procedure and the sedation                            options and risks were discussed with the patient.                            All questions were answered, and informed consent                            was obtained. Prior Anticoagulants: The patient has                            taken no previous anticoagulant or antiplatelet                            agents. ASA Grade Assessment: II - A patient with                            mild systemic disease. After reviewing the risks                            and benefits, the patient was deemed in                            satisfactory condition to undergo the procedure.                           After obtaining informed consent, the endoscope was  passed under direct vision. Throughout the                            procedure, the patient's blood pressure, pulse, and                            oxygen saturations were monitored continuously. The                            Model GIF-HQ190 [id number]) scope was introduced                            through the mouth, and advanced to the second part                            of duodenum. The upper GI endoscopy was                             accomplished without difficulty. The patient                            tolerated the procedure well. Scope In: Scope Out: Findings:                 Diffuse moderate erythema was found in the distal                            esophagus.                           The exam of the esophagus was otherwise normal.                           No endoscopic abnormality was evident in the                            esophagus to explain the patient's complaint of                            dysphagia. It was decided, however, to proceed with                            dilation of the entire esophagus. A guidewire was                            placed and the scope was withdrawn. Dilation was                            performed with a Savary dilator with no resistance                            at 16 mm. No heme.                           The entire examined stomach  was normal.                           The duodenal bulb and second portion of the                            duodenum were normal. Complications:            No immediate complications. Estimated Blood Loss:     Estimated blood loss: none. Impression:               - Erythema in the distal esophagus.                           - Normal stomach.                           - Normal duodenal bulb and second portion of the                            duodenum.                           - No specimens collected. Recommendation:           - Patient has a contact number available for                            emergencies. The signs and symptoms of potential                            delayed complications were discussed with the                            patient. Return to normal activities tomorrow.                            Written discharge instructions were provided to the                            patient.                           - Clear liquid diet for 2 hours, then advance as                             tolerated to soft diet today. Resume prior diet                            tomorrow.                           - Antireflux measures.                           - Continue present medications.                           -  Protonix 40 mg po qam, 2 months of refills. Ladene Artist, MD 07/12/2017 3:20:21 PM This report has been signed electronically.

## 2017-07-12 NOTE — Op Note (Signed)
[facility] Patient Name: Marie Ortega Procedure Date: 07/12/2017 2:37 PM MRN: [id number] Endoscopist: Ladene Artist , MD Age: 47 Referring MD:  Date of Birth: [birth year] Gender: Female Account #: [id number] Procedure:                Colonoscopy Indications:              Surveillance: Personal history of adenomatous                            polyps on last colonoscopy 3 years ago. Family                            history of colon cancer. Medicines:                Monitored Anesthesia Care Procedure:                Pre-Anesthesia Assessment:                           - Prior to the procedure, a History and Physical                            was performed, and patient medications and                            allergies were reviewed. The patient's tolerance of                            previous anesthesia was also reviewed. The risks                            and benefits of the procedure and the sedation                            options and risks were discussed with the patient.                            All questions were answered, and informed consent                            was obtained. Prior Anticoagulants: The patient has                            taken no previous anticoagulant or antiplatelet                            agents. ASA Grade Assessment: II - A patient with                            mild systemic disease. After reviewing the risks                            and benefits, the patient was deemed in  satisfactory condition to undergo the procedure.                           After obtaining informed consent, the colonoscope                            was passed under direct vision. Throughout the                            procedure, the patient's blood pressure, pulse, and                            oxygen saturations were monitored continuously. The                            Colonoscope was introduced through the  anus and                            advanced to the the cecum, identified by                            appendiceal orifice and ileocecal valve. The                            ileocecal valve, appendiceal orifice, and rectum                            were photographed. The quality of the bowel                            preparation was excellent. The colonoscopy was                            performed without difficulty. The patient tolerated                            the procedure well. Scope In: 2:48:45 PM Scope Out: 3:03:54 PM Scope Withdrawal Time: 0 hours 10 minutes 42 seconds  Total Procedure Duration: 0 hours 15 minutes 9 seconds  Findings:                 The perianal and digital rectal examinations were                            normal.                           Two sessile polyps were found in the transverse                            colon. The polyps were 4 mm in size. These polyps                            were removed with a cold biopsy forceps. Resection  and retrieval were complete.                           The exam was otherwise without abnormality on                            direct and retroflexion views. Complications:            No immediate complications. Estimated blood loss:                            None. Estimated Blood Loss:     Estimated blood loss: none. Impression:               - Two 4 mm polyps in the transverse colon, removed                            with a cold biopsy forceps. Resected and retrieved.                           - The examination was otherwise normal on direct                            and retroflexion views. Recommendation:           - Repeat colonoscopy in 3 years for surveillance.                           - Patient has a contact number available for                            emergencies. The signs and symptoms of potential                            delayed complications were discussed with the                             patient. Return to normal activities tomorrow.                            Written discharge instructions were provided to the                            patient.                           - Resume previous diet.                           - Continue present medications.                           - Await pathology results. Ladene Artist, MD 07/12/2017 3:13:21 PM This report has been signed electronically.

## 2017-07-13 ENCOUNTER — Telehealth: Payer: Self-pay

## 2017-07-13 NOTE — Telephone Encounter (Signed)
  Follow up Call-  Call back number 07/12/2017  Post procedure Call Back phone  # [phone]  Permission to leave phone message Yes  Some recent data might be hidden     Patient questions:  Do you have a fever, pain , or abdominal swelling? No. Pain Score  0 *  Have you tolerated food without any problems? Yes.    Have you been able to return to your normal activities? Yes.    Do you have any questions about your discharge instructions: Diet   No. Medications  No. Follow up visit  No.  Do you have questions or concerns about your Care? No.  Actions: * If pain score is 4 or above: No action needed, pain <4.

## 2017-07-20 ENCOUNTER — Telehealth: Payer: Self-pay | Admitting: *Deleted

## 2017-07-20 DIAGNOSIS — N200 Calculus of kidney: Secondary | ICD-10-CM

## 2017-07-20 NOTE — Telephone Encounter (Signed)
Spoke with pt. She states she was just discharged from a hospital in [place] on her way back from [place]. Had sudden onset of excruciating lower abdomen pain.  EMS was called and pt was taken to the ER. She states "a Kidney stone was not seen but tests had the appearance that she may have passed a kidney stone".  She is requesting referral to a urologist. Advised pt she should schedule ER follow up. She reports that she is bringing results with her. Appointment has been scheduled for Monday, 07/25/17 at 5:40pm.

## 2017-07-20 NOTE — Telephone Encounter (Signed)
Copied from [place] [id number]. Topic: Referral - Request >> Jul 20, 2017  2:23 PM Oliver Pila B wrote: Reason for CRM: pt called and asked to get a referral for a urologist; pt has been placed into a hospital at [facility] ([place] b/c she is out of town

## 2017-07-20 NOTE — Addendum Note (Signed)
Addended by: Debbrah Alar on: 07/20/2017 03:57 PM   Modules accepted: Orders

## 2017-07-24 ENCOUNTER — Encounter: Payer: Self-pay | Admitting: Gastroenterology

## 2017-07-25 ENCOUNTER — Ambulatory Visit: Payer: BLUE CROSS/BLUE SHIELD | Admitting: Family

## 2017-07-25 ENCOUNTER — Encounter: Payer: Self-pay | Admitting: Family

## 2017-07-25 VITALS — BP 104/67 | HR 73 | Temp 98.6°F | Resp 16 | Ht 65.0 in | Wt 145.0 lb

## 2017-07-25 DIAGNOSIS — N200 Calculus of kidney: Secondary | ICD-10-CM

## 2017-07-25 DIAGNOSIS — Z0000 Encounter for general adult medical examination without abnormal findings: Secondary | ICD-10-CM

## 2017-07-25 NOTE — Patient Instructions (Signed)
Please complete lab work prior to leaving. You should be contacted prior to leaving.

## 2017-07-25 NOTE — Progress Notes (Signed)
Subjective:    Patient ID: Marie Ortega, female    DOB: Oct 06, 1970, 47 y.o.   MRN: [id number]  HPI  Patient is a 47 yr old female who presents today for ER follow up. Reports that she had some bladder discomfort and right sided low back pain.  Was travelling home from [place] and pain became very severe, she had to lay on the floor of a gas station and EMS was called. She brings CT report which noted bilateral nephrolithiasis and subtle inflammatory changes surrounding the right ureter.  She reports that she was given toradol in the ER and her pain improved. Has had no pain/fever, dysuria since returning home. Has been straining her urine. No obvious stones but she did note that the strainer became "clogged" with "thick urine."   Review of Systems See HPI  Past Medical History:  Diagnosis Date  . Adenomatous colon polyp 12/2008   gets colonoscopies every 3 years.    . Allergy   . Depression    no per pt  . Dysplastic nevi   . History of chicken pox   . History of telogen effluvium   . Vitamin D deficiency      Social History   Socioeconomic History  . Marital status: Married    Spouse name: Not on file  . Number of children: 2  . Years of education: Not on file  . Highest education level: Not on file  Occupational History  . Occupation: Training and development officer: [employer]  Social Needs  . Financial resource strain: Not on file  . Food insecurity:    Worry: Not on file    Inability: Not on file  . Transportation needs:    Medical: Not on file    Non-medical: Not on file  Tobacco Use  . Smoking status: Never Smoker  . Smokeless tobacco: Never Used  Substance and Sexual Activity  . Alcohol use: No  . Drug use: No  . Sexual activity: Yes    Birth control/protection: Pill  Lifestyle  . Physical activity:    Days per week: Not on file    Minutes per session: Not on file  . Stress: Not on file  Relationships  . Social connections:    Talks on phone: Not on file     Gets together: Not on file    Attends religious service: Not on file    Active member of club or organization: Not on file    Attends meetings of clubs or organizations: Not on file    Relationship status: Not on file  . Intimate partner violence:    Fear of current or ex partner: Not on file    Emotionally abused: Not on file    Physically abused: Not on file    Forced sexual activity: Not on file  Other Topics Concern  . Not on file  Social History Narrative   Caffeine use: none   Regular exercise:  No   2 children son Murrell Redden and daughter sarah 2007   Works as a English as a second language teacher   Married   No pets    Past Surgical History:  Procedure Laterality Date  . COLONOSCOPY    . HIP SURGERY Right 07/13/12   Dr Thea [facility]    Family History  Problem Relation Age of Onset  . Colon cancer Mother 57  . Colon polyps Maternal Grandmother   . Sudden death Neg Hx   . Hypertension Neg Hx   .  Hyperlipidemia Neg Hx   . Heart attack Neg Hx   . Diabetes Neg Hx   . Stomach cancer Neg Hx   . Rectal cancer Neg Hx     No Known Allergies  Current Outpatient Medications on File Prior to Visit  Medication Sig Dispense Refill  . furosemide (LASIX) 20 MG tablet TAKE 1 TABLET EVERY DAY AS NEEDED 90 tablet 1  . loratadine (CLARITIN) 10 MG tablet Take 10 mg by mouth daily.    . Minoxidil 5 % FOAM Apply topically daily.    . pantoprazole (PROTONIX) 40 MG tablet Take 1 tablet (40 mg total) by mouth daily. 30 tablet 2  . Probiotic Product (ALIGN PO) Take by mouth daily.    . TAYTULLA 1-20 MG-MCG(24) CAPS Take 1 capsule by mouth daily.     Current Facility-Administered Medications on File Prior to Visit  Medication Dose Route Frequency Provider Last Rate Last Dose  . 0.9 %  sodium chloride infusion  500 mL Intravenous Continuous Ladene Artist, MD        BP 104/67 (BP Location: Right Arm, Cuff Size: Normal)   Pulse 73   Temp 98.6 F (37 C) (Oral)   Resp 16   Ht 5' 5"   (1.651 m)   Wt 145 lb (65.8 kg)   LMP 07/16/2017   SpO2 100%   BMI 24.13 kg/m       Objective:   Physical Exam  Constitutional: She appears well-developed and well-nourished.  Cardiovascular: Normal rate, regular rhythm and normal heart sounds.  No murmur heard. Pulmonary/Chest: Effort normal and breath sounds normal. No respiratory distress. She has no wheezes.  Abdominal: Soft. She exhibits no distension. There is no tenderness. There is no guarding and no CVA tenderness.  Psychiatric: She has a normal mood and affect. Her behavior is normal. Judgment and thought content normal.          Assessment & Plan:  Nephrolithiasis- CT and hx support that she had a kidney stone in the right ureter that passed. Clinically improved. Will check UA/Culture, CBC, bmet. Refer to urology for ongoing management of her nephrolithiasis.  She is also requesting MMR immunity status.  Will check titers.

## 2017-07-26 LAB — URINALYSIS, ROUTINE W REFLEX MICROSCOPIC
Bilirubin Urine: NEGATIVE
HGB URINE DIPSTICK: NEGATIVE
Ketones, ur: NEGATIVE
LEUKOCYTES UA: NEGATIVE
Nitrite: NEGATIVE
PH: 8 (ref 5.0–8.0)
RBC / HPF: NONE SEEN (ref 0–?)
Specific Gravity, Urine: 1.015 (ref 1.000–1.030)
Urine Glucose: NEGATIVE
Urobilinogen, UA: 1 (ref 0.0–1.0)

## 2017-07-26 LAB — BASIC METABOLIC PANEL
BUN: 12 mg/dL (ref 6–23)
CHLORIDE: 104 meq/L (ref 96–112)
CO2: 30 meq/L (ref 19–32)
CREATININE: 0.89 mg/dL (ref 0.40–1.20)
Calcium: 9.6 mg/dL (ref 8.4–10.5)
GFR: 72.94 mL/min (ref >60.00)
GLUCOSE: 100 mg/dL — AB (ref 70–99)
Potassium: 5.5 mEq/L — ABNORMAL HIGH (ref 3.5–5.1)
Sodium: 142 mEq/L (ref 135–145)

## 2017-07-26 LAB — CBC WITH DIFFERENTIAL/PLATELET
BASOS ABS: 0 10*3/uL (ref 0.0–0.1)
Basophils Relative: 0.6 % (ref 0.0–3.0)
EOS PCT: 0.9 % (ref 0.0–5.0)
Eosinophils Absolute: 0.1 10*3/uL (ref 0.0–0.7)
HEMATOCRIT: 38.4 % (ref 36.0–46.0)
Hemoglobin: 12.9 g/dL (ref 12.0–15.0)
LYMPHS PCT: 28.8 % (ref 12.0–46.0)
Lymphs Abs: 1.7 10*3/uL (ref 0.7–4.0)
MCHC: 33.7 g/dL (ref 30.0–36.0)
MCV: 93.4 fl (ref 78.0–100.0)
MONOS PCT: 7.2 % (ref 3.0–12.0)
Monocytes Absolute: 0.4 10*3/uL (ref 0.1–1.0)
NEUTROS ABS: 3.8 10*3/uL (ref 1.4–7.7)
Neutrophils Relative %: 62.5 % (ref 43.0–77.0)
Platelets: 249 10*3/uL (ref 150.0–400.0)
RBC: 4.11 Mil/uL (ref 3.87–5.11)
RDW: 13 % (ref 11.5–15.5)
WBC: 6 10*3/uL (ref 4.0–10.5)

## 2017-07-27 ENCOUNTER — Telehealth: Payer: Self-pay | Admitting: Family

## 2017-07-27 DIAGNOSIS — E875 Hyperkalemia: Secondary | ICD-10-CM

## 2017-07-27 LAB — MEASLES/MUMPS/RUBELLA IMMUNITY
RUBELLA: 2.26 {index}
Rubeola IgG: <25 AU/mL — ABNORMAL LOW

## 2017-07-27 NOTE — Telephone Encounter (Signed)
Left detailed message on pt's voicemail of below information and to call back to schedule lab appointment and nurse visit for MMR booster. Future lab order entered. OK for [facility] / triage to discuss and schedule both appointments.

## 2017-07-27 NOTE — Telephone Encounter (Signed)
Potassium is high. Please repeat bmet in the next few days.  Also,  Measles titer is low. Needs Nurse visit for MMR booster please. Urine culture still pending. I will notify her via mychart.

## 2017-07-29 LAB — URINE CULTURE
MICRO NUMBER: 90810872
SPECIMEN QUALITY:: ADEQUATE

## 2017-08-17 ENCOUNTER — Other Ambulatory Visit (INDEPENDENT_AMBULATORY_CARE_PROVIDER_SITE_OTHER): Payer: BLUE CROSS/BLUE SHIELD

## 2017-08-17 ENCOUNTER — Ambulatory Visit (INDEPENDENT_AMBULATORY_CARE_PROVIDER_SITE_OTHER): Payer: BLUE CROSS/BLUE SHIELD

## 2017-08-17 DIAGNOSIS — E875 Hyperkalemia: Secondary | ICD-10-CM

## 2017-08-17 DIAGNOSIS — Z23 Encounter for immunization: Secondary | ICD-10-CM

## 2017-08-17 LAB — BASIC METABOLIC PANEL
BUN: 14 mg/dL (ref 6–23)
CALCIUM: 9.6 mg/dL (ref 8.4–10.5)
CHLORIDE: 104 meq/L (ref 96–112)
CO2: 32 mEq/L (ref 19–32)
CREATININE: 0.84 mg/dL (ref 0.40–1.20)
GFR: 77.96 mL/min (ref >60.00)
Glucose, Bld: 108 mg/dL — ABNORMAL HIGH (ref 70–99)
Potassium: 5.1 mEq/L (ref 3.5–5.1)
Sodium: 139 mEq/L (ref 135–145)

## 2017-09-06 MED ORDER — PANTOPRAZOLE SODIUM 40 MG PO TBEC: 40.0000 mg | DELAYED_RELEASE_TABLET | Freq: Every day | ORAL | 3 refills | Status: DC

## 2019-07-23 IMAGING — RF DG ESOPHAGUS
15 of 24 series · 15 of 24 positions shown · non-contrast
Comparison: None.

CLINICAL DATA: Dysphagia.  Difficulty swallowing

EXAM:
ESOPHOGRAM/BARIUM SWALLOW
TECHNIQUE: Single contrast examination was performed using  thin barium.
FLUOROSCOPY TIME:  Fluoroscopy Time:  2 minutes 42 seconds
Radiation Exposure Index (if provided by the fluoroscopic device):
Number of Acquired Spot Images: 0

[Series 1: run · 1 of 6 slices shown (1 of 15)]
[im 1/6]
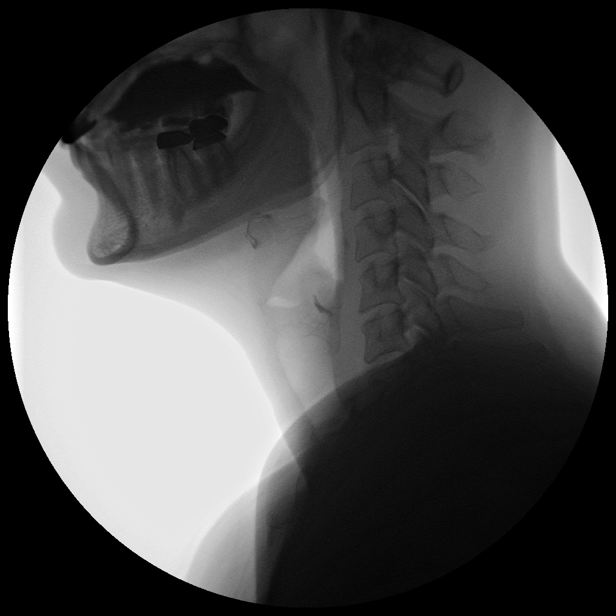

[Series 3: run · 1 of 1 slices shown (2 of 15)]
[im 1/1]
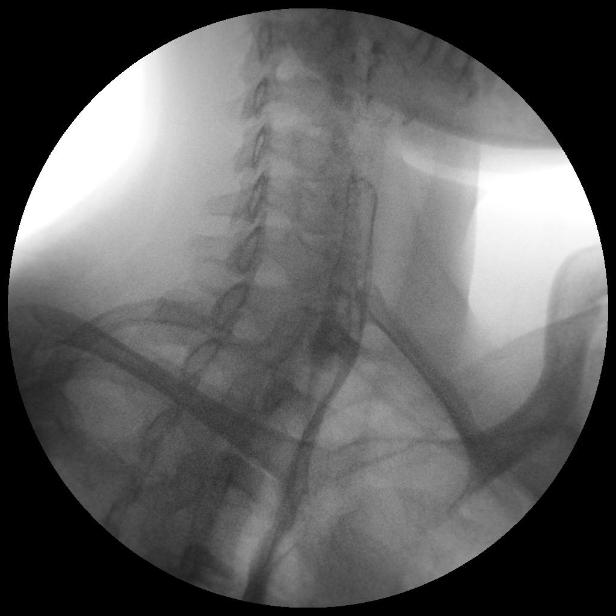

[Series 5: run · 1 of 1 slices shown (3 of 15)]
[im 1/1]
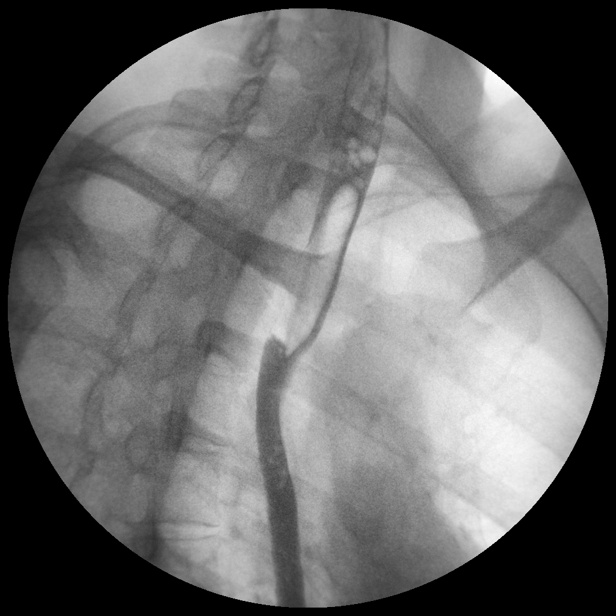

[Series 6: run · 1 of 1 slices shown (4 of 15)]
[im 1/1]
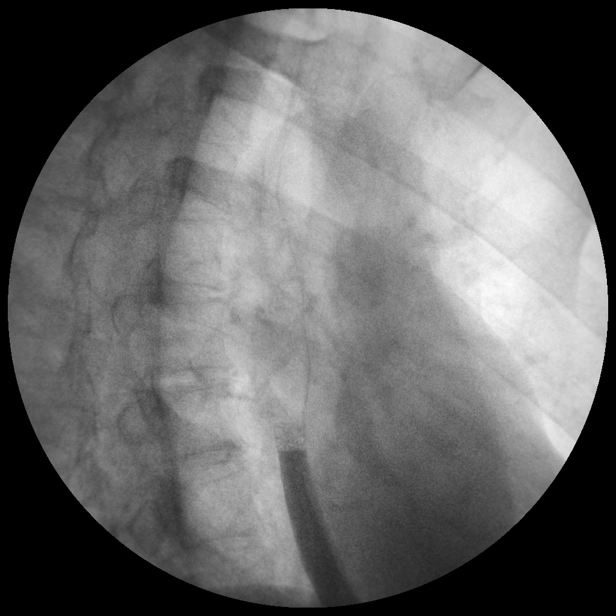

[Series 8: run · 1 of 1 slices shown (5 of 15)]
[im 1/1]
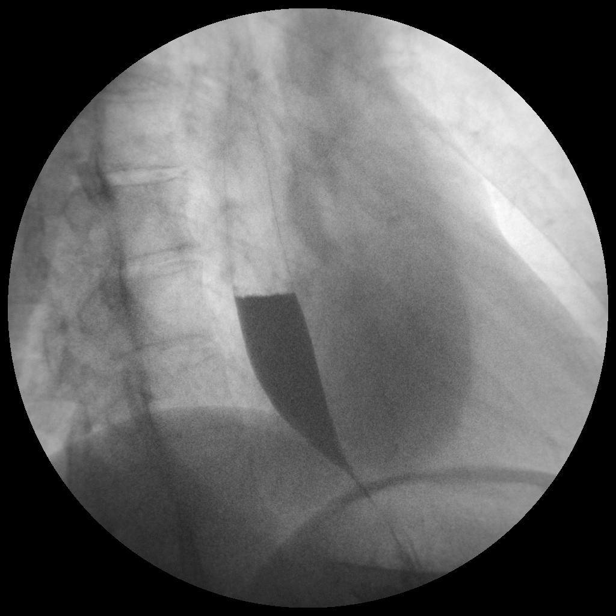

[Series 9: run · 1 of 1 slices shown (6 of 15)]
[im 1/1]
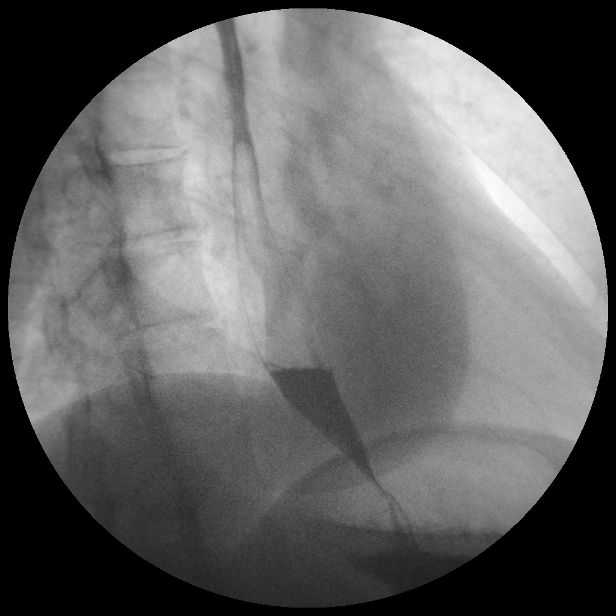

[Series 11: run · 1 of 1 slices shown (7 of 15)]
[im 1/1]
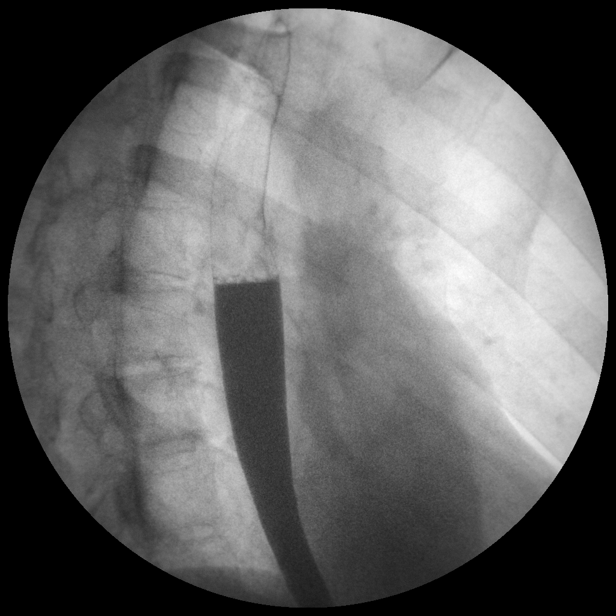

[Series 13: run · 1 of 1 slices shown (8 of 15)]
[im 1/1]
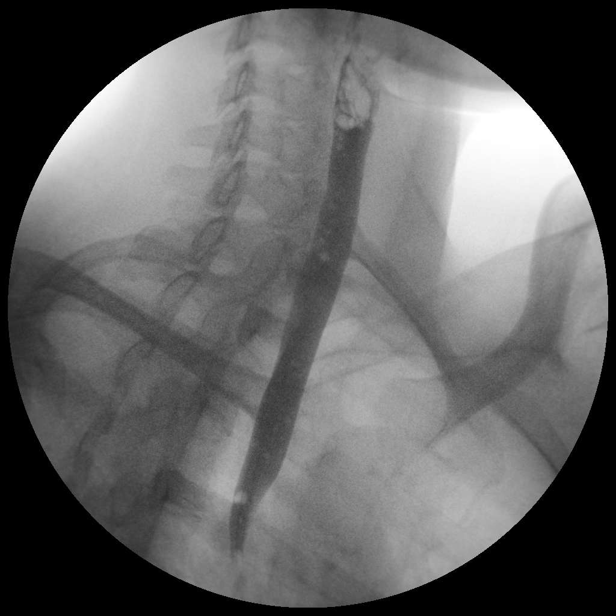

[Series 14: run · 1 of 1 slices shown (9 of 15)]
[im 1/1]
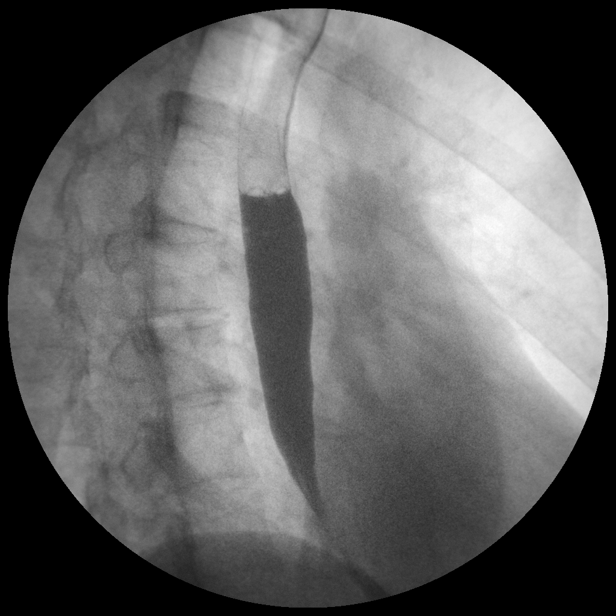

[Series 16: run · 1 of 1 slices shown (10 of 15)]
[im 1/1]
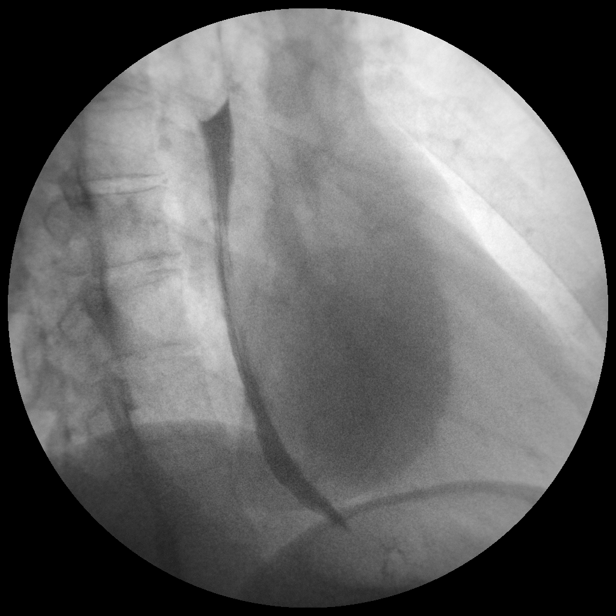

[Series 17: run · 1 of 1 slices shown (11 of 15)]
[im 1/1]
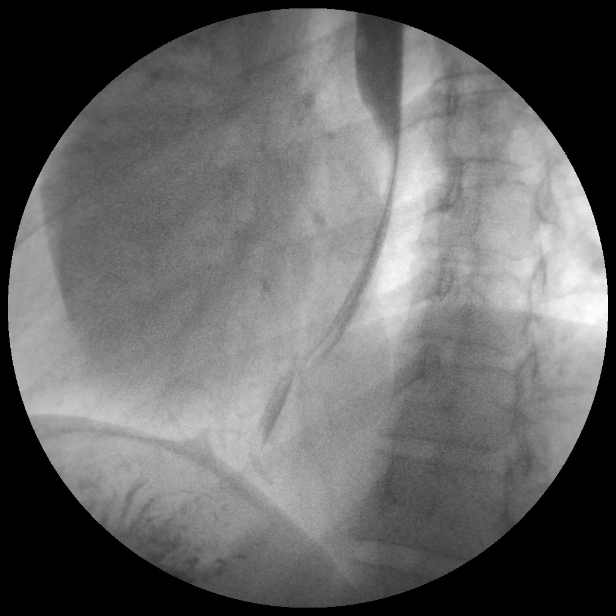

[Series 19: run · 1 of 1 slices shown (12 of 15)]
[im 1/1]
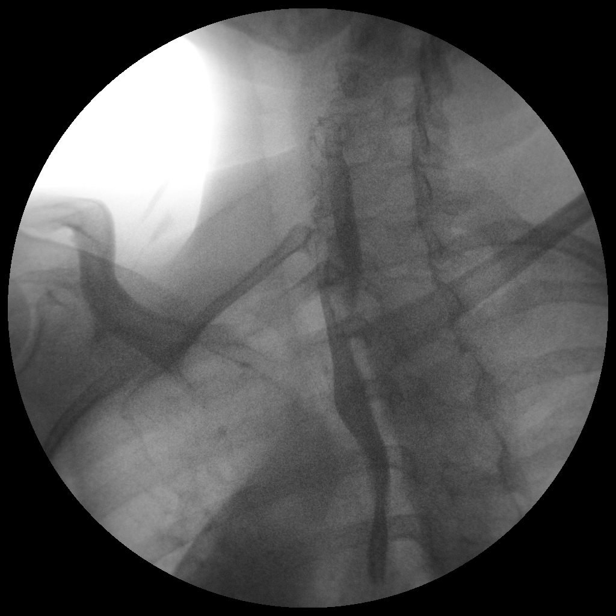

[Series 21: run · 1 of 1 slices shown (13 of 15)]
[im 1/1]
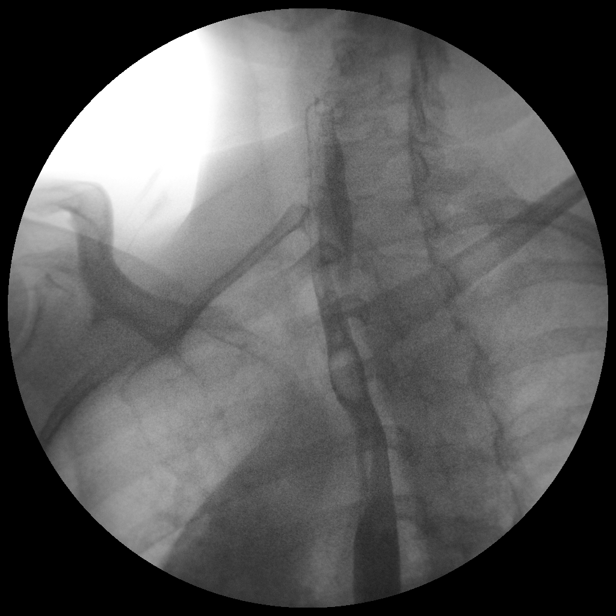

[Series 22: run · 1 of 1 slices shown (14 of 15)]
[im 1/1]
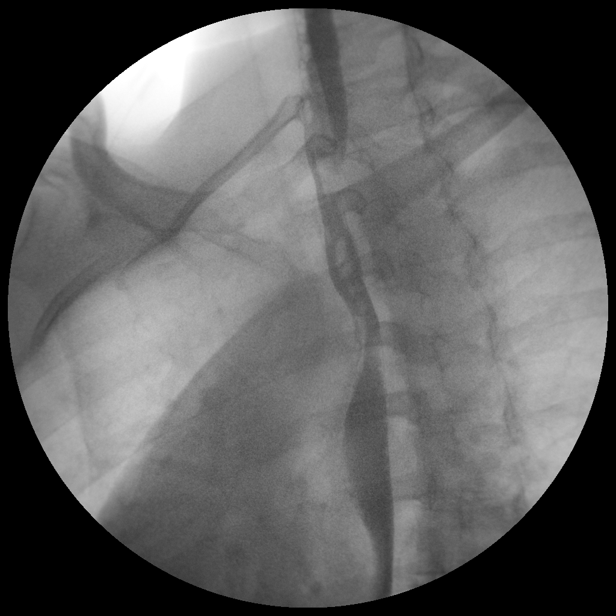

[Series 24: run · 1 of 1 slices shown (15 of 15)]
[im 1/1]
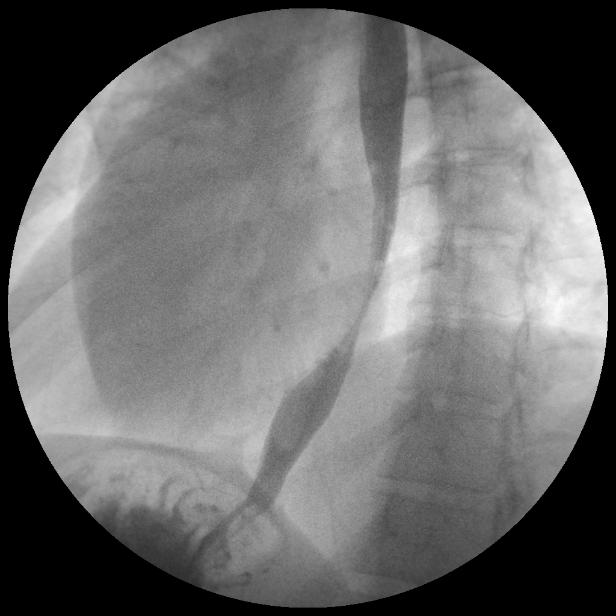

[15 of 24 positions shown; findings below may reference images not displayed]

FINDINGS: Fluoroscopic evaluation of swallowing demonstrates no laryngeal
penetration or aspiration. Cervical esophagus is unremarkable.

There is disruption of [DATE] primary esophageal peristaltic waves. No
fixed stricture. No mass or fold thickening. The patient was able to
swallow a 13 mm barium tablet which freely passed into the stomach.
No reflux with the water siphon maneuver.
IMPRESSION: Nonspecific esophageal motility disorder. Otherwise unremarkable
study.

## 2019-11-28 ENCOUNTER — Encounter: Payer: Self-pay | Admitting: Family

## 2019-11-28 ENCOUNTER — Ambulatory Visit (INDEPENDENT_AMBULATORY_CARE_PROVIDER_SITE_OTHER): Payer: BC Managed Care – PPO | Admitting: Family

## 2019-11-28 ENCOUNTER — Other Ambulatory Visit: Payer: Self-pay

## 2019-11-28 VITALS — BP 120/68 | HR 75 | Temp 98.8°F | Resp 16 | Ht 65.0 in | Wt 140.0 lb

## 2019-11-28 DIAGNOSIS — E559 Vitamin D deficiency, unspecified: Secondary | ICD-10-CM

## 2019-11-28 DIAGNOSIS — E785 Hyperlipidemia, unspecified: Secondary | ICD-10-CM | POA: Diagnosis not present

## 2019-11-28 DIAGNOSIS — M79645 Pain in left finger(s): Secondary | ICD-10-CM | POA: Diagnosis not present

## 2019-11-28 DIAGNOSIS — M25562 Pain in left knee: Secondary | ICD-10-CM

## 2019-11-28 DIAGNOSIS — M25561 Pain in right knee: Secondary | ICD-10-CM

## 2019-11-28 DIAGNOSIS — G8929 Other chronic pain: Secondary | ICD-10-CM

## 2019-11-28 DIAGNOSIS — E1169 Type 2 diabetes mellitus with other specified complication: Secondary | ICD-10-CM

## 2019-11-28 DIAGNOSIS — Z7185 Encounter for immunization safety counseling: Secondary | ICD-10-CM

## 2019-11-28 LAB — LIPID PANEL
Cholesterol: 200 mg/dL (ref 0–200)
HDL: 65.4 mg/dL (ref >39.00)
LDL Cholesterol: 106 mg/dL — ABNORMAL HIGH (ref 0–99)
NonHDL: 134.45
Total CHOL/HDL Ratio: 3
Triglycerides: 140 mg/dL (ref 0.0–149.0)
VLDL: 28 mg/dL (ref 0.0–40.0)

## 2019-11-28 LAB — VITAMIN D 25 HYDROXY (VIT D DEFICIENCY, FRACTURES): VITD: 38.88 ng/mL (ref 30.00–100.00)

## 2019-11-28 MED ORDER — MELOXICAM 7.5 MG PO TABS: 7.5000 mg | ORAL_TABLET | Freq: Every day | ORAL | 0 refills | Status: DC

## 2019-11-28 NOTE — Patient Instructions (Signed)
Please complete lab work prior to leaving.  Begin meloxicam once daily for thumb and knee pain. Call if symptoms worsen or if symptoms fail to improve in 2 weeks.

## 2019-11-28 NOTE — Progress Notes (Signed)
Subjective:    Patient ID: Marie Ortega, female    DOB: Jun 03, 1970, 49 y.o.   MRN: [id number]  HPI  Reports that she has had an aching in the left thumb. In the last week it has really bothered her.    She reports tenderness right knee above patella medially and left knee below patella medially.   She has been using ibuprofen prn.   Lab Results  Component Value Date   CHOL 208 (H) 05/11/2016   HDL 59.10 05/11/2016   LDLCALC 123 (H) 05/11/2016   TRIG 131.0 05/11/2016   CHOLHDL 4 05/11/2016    Review of Systems    see HPI  Past Medical History:  Diagnosis Date  . Adenomatous colon polyp 12/2008   gets colonoscopies every 3 years.    . Allergy   . Depression    no per pt  . Dysplastic nevi   . History of chicken pox   . History of telogen effluvium   . Vitamin D deficiency      Social History   Socioeconomic History  . Marital status: Married    Spouse name: Not on file  . Number of children: 2  . Years of education: Not on file  . Highest education level: Not on file  Occupational History  . Occupation: Training and development officer: [employer]  Tobacco Use  . Smoking status: Never Smoker  . Smokeless tobacco: Never Used  Substance and Sexual Activity  . Alcohol use: No  . Drug use: No  . Sexual activity: Yes    Birth control/protection: Pill  Other Topics Concern  . Not on file  Social History Narrative   Caffeine use: none   Regular exercise:  No   2 children son Murrell Redden and daughter sarah 2007   Works as a English as a second language teacher   Married   No pets   Social Determinants of Radio broadcast assistant Strain:   . Difficulty of Paying Living Expenses: Not on file  Food Insecurity:   . Worried About Charity fundraiser in the Last Year: Not on file  . Ran Out of Food in the Last Year: Not on file  Transportation Needs:   . Lack of Transportation (Medical): Not on file  . Lack of Transportation (Non-Medical): Not on file  Physical Activity:   . Days of  Exercise per Week: Not on file  . Minutes of Exercise per Session: Not on file  Stress:   . Feeling of Stress : Not on file  Social Connections:   . Frequency of Communication with Friends and Family: Not on file  . Frequency of Social Gatherings with Friends and Family: Not on file  . Attends Religious Services: Not on file  . Active Member of Clubs or Organizations: Not on file  . Attends Archivist Meetings: Not on file  . Marital Status: Not on file  Intimate Partner Violence:   . Fear of Current or Ex-Partner: Not on file  . Emotionally Abused: Not on file  . Physically Abused: Not on file  . Sexually Abused: Not on file    Past Surgical History:  Procedure Laterality Date  . COLONOSCOPY    . HIP SURGERY Right 07/13/12   Dr Thea [facility]    Family History  Problem Relation Age of Onset  . Colon cancer Mother 47  . Colon polyps Maternal Grandmother   . Sudden death Neg Hx   . Hypertension Neg Hx   .  Hyperlipidemia Neg Hx   . Heart attack Neg Hx   . Diabetes Neg Hx   . Stomach cancer Neg Hx   . Rectal cancer Neg Hx     No Known Allergies  Current Outpatient Medications on File Prior to Visit  Medication Sig Dispense Refill  . furosemide (LASIX) 20 MG tablet TAKE 1 TABLET EVERY DAY AS NEEDED 90 tablet 1  . loratadine (CLARITIN) 10 MG tablet Take 10 mg by mouth daily.    . pantoprazole (PROTONIX) 40 MG tablet Take 1 tablet (40 mg total) by mouth daily. 90 tablet 3  . Probiotic Product (ALIGN PO) Take by mouth daily.    . TAYTULLA 1-20 MG-MCG(24) CAPS Take 1 capsule by mouth daily.     Current Facility-Administered Medications on File Prior to Visit  Medication Dose Route Frequency Provider Last Rate Last Admin  . 0.9 %  sodium chloride infusion  500 mL Intravenous Continuous Ladene Artist, MD        BP 120/68 (BP Location: Right Arm, Patient Position: Sitting, Cuff Size: Small)   Pulse 75   Temp 98.8 F (37.1 C) (Oral)   Resp  16   Ht 5\' 5"  (1.651 m)   Wt 140 lb (63.5 kg)   SpO2 100%   BMI 23.30 kg/m    Objective:   Physical Exam Constitutional:      Appearance: She is well-developed.  Cardiovascular:     Rate and Rhythm: Normal rate and regular rhythm.     Heart sounds: Normal heart sounds. No murmur heard.   Pulmonary:     Effort: Pulmonary effort is normal. No respiratory distress.     Breath sounds: Normal breath sounds. No wheezing.  Musculoskeletal:     Comments: No knee swelling noted + tenderness to palpation left knee above   + tenderness left knee beneath patella medially  Left thumb IP joint tender to palpation without swelling or erythema  Psychiatric:        Behavior: Behavior normal.        Thought Content: Thought content normal.        Judgment: Judgment normal.           Assessment & Plan:  Bilateral knee pain- will rx with trial of meloxicam. She will let me know if pain is not improved in 2 weeks and we will plan referral to orthopedics at that time.   Left thumb pain- recommended trial of meloxicam. I also gave her a thumb splint to wear for the next few weeks.  Vit D deficiency- check follow up vit D level.  Hyperlipidemia- check follow up lipid panel.  immunization counseling- patient declines covid vaccination.  Discussed the risks/benefits with patient and I advised her that I think it is in her best interest to obtain covid vaccination.    This visit occurred during the SARS-CoV-2 public health emergency.  Safety protocols were in place, including screening questions prior to the visit, additional usage of staff PPE, and extensive cleaning of exam room while observing appropriate contact time as indicated for disinfecting solutions.

## 2020-01-01 ENCOUNTER — Ambulatory Visit (INDEPENDENT_AMBULATORY_CARE_PROVIDER_SITE_OTHER): Payer: BC Managed Care – PPO | Admitting: Family Medicine

## 2020-01-01 ENCOUNTER — Encounter: Payer: Self-pay | Admitting: Family Medicine

## 2020-01-01 ENCOUNTER — Other Ambulatory Visit: Payer: Self-pay

## 2020-01-01 ENCOUNTER — Ambulatory Visit: Payer: Self-pay

## 2020-01-01 VITALS — BP 128/83 | HR 85 | Ht 65.0 in | Wt 140.0 lb

## 2020-01-01 DIAGNOSIS — M659 Synovitis and tenosynovitis, unspecified: Secondary | ICD-10-CM

## 2020-01-01 DIAGNOSIS — M25571 Pain in right ankle and joints of right foot: Secondary | ICD-10-CM

## 2020-01-01 NOTE — Assessment & Plan Note (Signed)
There appears to be hyperechoic changes on ultrasound with could represent a gouty source to her pain.  Seems synovitis in nature.  Has had improvement of her pain over the past 2 days. -Counseled on home exercise therapy and supportive care. -Counseled on taking Aleve if symptoms recur. -Could consider lab testing, aspiration injection if symptoms recur.

## 2020-01-01 NOTE — Patient Instructions (Signed)
Good to see you Please try aleve if the pain returns.  Please stay well hydrated   Please send me a message in MyChart with any questions or updates.  Please see [place] back as needed.   --Dr. Raeford Razor

## 2020-01-01 NOTE — Progress Notes (Signed)
Marie Ortega - 49 y.o. female MRN [id number]  Date of birth: 1970-03-28  SUBJECTIVE:  Including CC & ROS.  Chief Complaint  Patient presents with  . Ankle Pain    Right x 4 days    Marie Ortega is a 49 y.o. female that is presenting with right ankle pain.  She has had improvement of the pain over the past couple days.  She had extreme pain over the weekend.  The pain was localized to the joint and had stiffness.  She is unable to bear weight without significant pain.  No inciting event or injury.  Has been normal activities..   Review of Systems See HPI   HISTORY: Past Medical, Surgical, Social, and Family History Reviewed & Updated per EMR.   Pertinent Historical Findings include:  Past Medical History:  Diagnosis Date  . Adenomatous colon polyp 12/2008   gets colonoscopies every 3 years.    . Allergy   . Depression    no per pt  . Dysplastic nevi   . History of chicken pox   . History of telogen effluvium   . Vitamin D deficiency     Past Surgical History:  Procedure Laterality Date  . COLONOSCOPY    . HIP SURGERY Right 07/13/12   Dr Thea [facility]    Family History  Problem Relation Age of Onset  . Colon cancer Mother 17  . Colon polyps Maternal Grandmother   . Sudden death Neg Hx   . Hypertension Neg Hx   . Hyperlipidemia Neg Hx   . Heart attack Neg Hx   . Diabetes Neg Hx   . Stomach cancer Neg Hx   . Rectal cancer Neg Hx     Social History   Socioeconomic History  . Marital status: Married    Spouse name: Not on file  . Number of children: 2  . Years of education: Not on file  . Highest education level: Not on file  Occupational History  . Occupation: Training and development officer: [employer]  Tobacco Use  . Smoking status: Never Smoker  . Smokeless tobacco: Never Used  Substance and Sexual Activity  . Alcohol use: No  . Drug use: No  . Sexual activity: Yes    Birth control/protection: Pill  Other Topics Concern  . Not on  file  Social History Narrative   Caffeine use: none   Regular exercise:  No   2 children son Ovid Curd 2004 and daughter sarah 2007   Works as a English as a second language teacher   Married   No pets   Social Determinants of Radio broadcast assistant Strain: Not on Art therapist Insecurity: Not on file  Transportation Needs: Not on file  Physical Activity: Not on file  Stress: Not on file  Social Connections: Not on file  Intimate Partner Violence: Not on file     PHYSICAL EXAM:  VS: BP 128/83   Pulse 85   Ht 5\' 5"  (1.651 m)   Wt 140 lb (63.5 kg)   BMI 23.30 kg/m  Physical Exam Gen: NAD, alert, cooperative with exam, well-appearing MSK:  Right ankle: Normal range of motion. No swelling. No tenderness to palpation. Neurovascularly intact  Limited ultrasound: Right ankle:  Mild effusion noted in the ankle joint.  There is areas of hyper echogenicity which could represent crystal deposition. No hyperemia of the synovium. No changes of the distal tibia or talus.  Summary: Findings could represent an improvement of a gouty synovitis.  Ultrasound and interpretation by Clearance Coots, MD    ASSESSMENT & PLAN:   Synovitis of ankle There appears to be hyperechoic changes on ultrasound with could represent a gouty source to her pain.  Seems synovitis in nature.  Has had improvement of her pain over the past 2 days. -Counseled on home exercise therapy and supportive care. -Counseled on taking Aleve if symptoms recur. -Could consider lab testing, aspiration injection if symptoms recur.

## 2020-10-25 ENCOUNTER — Encounter: Payer: Self-pay | Admitting: Gastroenterology

## 2020-11-12 ENCOUNTER — Encounter: Payer: Self-pay | Admitting: Gastroenterology

## 2020-12-09 ENCOUNTER — Other Ambulatory Visit: Payer: Self-pay

## 2020-12-09 ENCOUNTER — Encounter: Payer: Self-pay | Admitting: Gastroenterology

## 2020-12-09 ENCOUNTER — Ambulatory Visit (AMBULATORY_SURGERY_CENTER): Payer: BC Managed Care – PPO

## 2020-12-09 VITALS — Ht 65.0 in | Wt 138.0 lb

## 2020-12-09 DIAGNOSIS — Z86010 Personal history of colonic polyps: Secondary | ICD-10-CM

## 2020-12-09 MED ORDER — PLENVU 140 G PO SOLR: 1.0000 | ORAL | 0 refills | Status: DC

## 2020-12-09 NOTE — Progress Notes (Signed)
Patient's pre-visit was done today over the phone with the patient. Name,DOB and address verified. Patient denies any allergies to Eggs and Soy. Patient denies any problems with anesthesia/sedation. Patient is not taking any diet pills or blood thinners. No home Oxygen.  ° °Packet of Prep instructions mailed to patient including a copy of a consent form-pt is aware. Patient understands to call us back with any questions or concerns. Patient is aware of our care-partner policy and Covid-19 safety protocol.  °

## 2020-12-10 ENCOUNTER — Encounter: Payer: Self-pay | Admitting: Gastroenterology

## 2020-12-29 ENCOUNTER — Encounter: Payer: Self-pay | Admitting: Gastroenterology

## 2020-12-29 ENCOUNTER — Ambulatory Visit (AMBULATORY_SURGERY_CENTER): Payer: BC Managed Care – PPO | Admitting: Gastroenterology

## 2020-12-29 VITALS — BP 115/54 | HR 75 | Temp 98.7°F | Resp 13 | Ht 65.0 in | Wt 138.0 lb

## 2020-12-29 DIAGNOSIS — Z800 Family history of malignant neoplasm of digestive organs: Secondary | ICD-10-CM | POA: Diagnosis not present

## 2020-12-29 DIAGNOSIS — Z86010 Personal history of colonic polyps: Secondary | ICD-10-CM

## 2020-12-29 MED ORDER — SODIUM CHLORIDE 0.9 % IV SOLN: 500.0000 mL | Freq: Once | INTRAVENOUS | Status: DC

## 2020-12-29 NOTE — Op Note (Signed)
[place] Patient Name: Marie Ortega Procedure Date: 12/29/2020 3:12 PM MRN: [id number] Endoscopist: Ladene Artist , MD Age: 50 Referring MD:  Date of Birth: 12-May-1970 Gender: Female Account #: [id number] Procedure:                Colonoscopy Indications:              Surveillance: Personal history of adenomatous and                            sessile serrated polyps on last colonoscopy > 3                            years ago, Family history of colon cancer, first                            degree relative < 36. Medicines:                Monitored Anesthesia Care Procedure:                Pre-Anesthesia Assessment:                           - Prior to the procedure, a History and Physical                            was performed, and patient medications and                            allergies were reviewed. The patient's tolerance of                            previous anesthesia was also reviewed. The risks                            and benefits of the procedure and the sedation                            options and risks were discussed with the patient.                            All questions were answered, and informed consent                            was obtained. Prior Anticoagulants: The patient has                            taken no previous anticoagulant or antiplatelet                            agents. ASA Grade Assessment: II - A patient with                            mild systemic disease. After reviewing the risks  and benefits, the patient was deemed in                            satisfactory condition to undergo the procedure.                           After obtaining informed consent, the colonoscope                            was passed under direct vision. Throughout the                            procedure, the patient's blood pressure, pulse, and                            oxygen saturations were monitored  continuously. The                            PCF-HQ190L Colonoscope was introduced through the                            anus and advanced to the the cecum, identified by                            appendiceal orifice and ileocecal valve. The                            ileocecal valve, appendiceal orifice, and rectum                            were photographed. The quality of the bowel                            preparation was excellent. The colonoscopy was                            performed without difficulty. The patient tolerated                            the procedure well. Scope In: 3:32:05 PM Scope Out: 3:48:22 PM Scope Withdrawal Time: 0 hours 11 minutes 37 seconds  Total Procedure Duration: 0 hours 16 minutes 17 seconds  Findings:                 The perianal and digital rectal examinations were                            normal.                           The entire examined colon appeared normal on direct                            and retroflexion views. Complications:            No immediate complications. Estimated blood  loss:                            None. Estimated Blood Loss:     Estimated blood loss: none. Impression:               - The entire examined colon is normal on direct and                            retroflexion views.                           - No specimens collected. Recommendation:           - Repeat colonoscopy in 5 years for surveillance.                           - Patient has a contact number available for                            emergencies. The signs and symptoms of potential                            delayed complications were discussed with the                            patient. Return to normal activities tomorrow.                            Written discharge instructions were provided to the                            patient.                           - Resume previous diet.                           - Continue present  medications. Ladene Artist, MD 12/29/2020 3:52:07 PM This report has been signed electronically.

## 2020-12-29 NOTE — Progress Notes (Signed)
To PACU, VSS. Report to RN.tb 

## 2020-12-29 NOTE — Patient Instructions (Signed)
Resume previous diet and medications. Repeat Colonoscopy in 5 years for surveillance.   YOU HAD AN ENDOSCOPIC PROCEDURE TODAY AT [facility]:   Refer to the procedure report that was given to you for any specific questions about what was found during the examination.  If the procedure report does not answer your questions, please call your gastroenterologist to clarify.  If you requested that your care partner not be given the details of your procedure findings, then the procedure report has been included in a sealed envelope for you to review at your convenience later.  YOU SHOULD EXPECT: Some feelings of bloating in the abdomen. Passage of more gas than usual.  Walking can help get rid of the air that was put into your GI tract during the procedure and reduce the bloating. If you had a lower endoscopy (such as a colonoscopy or flexible sigmoidoscopy) you may notice spotting of blood in your stool or on the toilet paper. If you underwent a bowel prep for your procedure, you may not have a normal bowel movement for a few days.  Please Note:  You might notice some irritation and congestion in your nose or some drainage.  This is from the oxygen used during your procedure.  There is no need for concern and it should clear up in a day or so.  SYMPTOMS TO REPORT IMMEDIATELY:  Following lower endoscopy (colonoscopy or flexible sigmoidoscopy):  Excessive amounts of blood in the stool  Significant tenderness or worsening of abdominal pains  Swelling of the abdomen that is new, acute  Fever of 100F or higher  For urgent or emergent issues, a gastroenterologist can be reached at any hour by calling [phone]. Do not use MyChart messaging for urgent concerns.    DIET:  We do recommend a small meal at first, but then you may proceed to your regular diet.  Drink plenty of fluids but you should avoid alcoholic beverages for 24 hours.  ACTIVITY:  You should plan to take it easy for the  rest of today and you should NOT DRIVE or use heavy machinery until tomorrow (because of the sedation medicines used during the test).    FOLLOW UP: Our staff will call the number listed on your records 48-72 hours following your procedure to check on you and address any questions or concerns that you may have regarding the information given to you following your procedure. If we do not reach you, we will leave a message.  We will attempt to reach you two times.  During this call, we will ask if you have developed any symptoms of COVID 19. If you develop any symptoms (ie: fever, flu-like symptoms, shortness of breath, cough etc.) before then, please call [phone].  If you test positive for Covid 19 in the 2 weeks post procedure, please call and report this information to [place].    If any biopsies were taken you will be contacted by phone or by letter within the next 1-3 weeks.  Please call us at [phone] if you have not heard about the biopsies in 3 weeks.    SIGNATURES/CONFIDENTIALITY: You and/or your care partner have signed paperwork which will be entered into your electronic medical record.  These signatures attest to the fact that that the information above on your After Visit Summary has been reviewed and is understood.  Full responsibility of the confidentiality of this discharge information lies with you and/or your care-partner.

## 2020-12-29 NOTE — Progress Notes (Signed)
VS completed by CW.   Pt's states no medical or surgical changes since previsit or office visit.  

## 2020-12-29 NOTE — Progress Notes (Signed)
History & Physical  Primary Care Physician:  Marie Alar, NP Primary Gastroenterologist: Marie Edward, MD  CHIEF COMPLAINT:  Personal history of colon polyps, Family history of colon cancer  HPI: Marie Ortega is a 50 y.o. female with a personal history of colon polyps, 1 tubular adenoma and one sessile serrated polyp, family history of colon cancer for colonoscopy.    Past Medical History:  Diagnosis Date   Adenomatous colon polyp 12/2008   gets colonoscopies every 3 years.     Allergy    Depression    no per pt   Dysplastic nevi    GERD (gastroesophageal reflux disease)    History of chicken pox    History of telogen effluvium    Vitamin D deficiency     Past Surgical History:  Procedure Laterality Date   COLONOSCOPY     HIP SURGERY Right 07/13/2012   Dr Marie Ortega [facility]   POLYPECTOMY     UPPER GASTROINTESTINAL ENDOSCOPY      Prior to Admission medications   Medication Sig Start Date End Date Taking? Authorizing Provider  cycloSPORINE (RESTASIS) 0.05 % ophthalmic emulsion Restasis 0.05 % eye drops in a dropperette   Yes [provider]  furosemide (LASIX) 20 MG tablet TAKE 1 TABLET EVERY DAY AS NEEDED 05/11/16   Marie Alar, NP  loratadine (CLARITIN) 10 MG tablet Take 10 mg by mouth daily.    [provider]  pantoprazole (PROTONIX) 40 MG tablet Take 1 tablet (40 mg total) by mouth daily. 09/06/17   Marie Artist, MD  Probiotic Product (ALIGN PO) Take by mouth daily.    [provider]  TAYTULLA 1-20 MG-MCG(24) CAPS Take 1 capsule by mouth daily. Patient not taking: Reported on 12/29/2020 01/26/16   [provider]    Current Outpatient Medications  Medication Sig Dispense Refill   cycloSPORINE (RESTASIS) 0.05 % ophthalmic emulsion Restasis 0.05 % eye drops in a dropperette     furosemide (LASIX) 20 MG tablet TAKE 1 TABLET EVERY DAY AS NEEDED 90 tablet 1   loratadine (CLARITIN) 10 MG tablet  Take 10 mg by mouth daily.     pantoprazole (PROTONIX) 40 MG tablet Take 1 tablet (40 mg total) by mouth daily. 90 tablet 3   Probiotic Product (ALIGN PO) Take by mouth daily.     TAYTULLA 1-20 MG-MCG(24) CAPS Take 1 capsule by mouth daily. (Patient not taking: Reported on 12/29/2020)     Current Facility-Administered Medications  Medication Dose Route Frequency Provider Last Rate Last Admin   0.9 %  sodium chloride infusion  500 mL Intravenous Once Marie Artist, MD        Allergies as of 12/29/2020   (No Known Allergies)    Family History  Problem Relation Age of Onset   Colon cancer Mother 65   Colon polyps Maternal Grandmother    Sudden death Neg Hx    Hypertension Neg Hx    Hyperlipidemia Neg Hx    Heart attack Neg Hx    Stomach cancer Neg Hx    Rectal cancer Neg Hx    Esophageal cancer Neg Hx     Social History   Socioeconomic History   Marital status: Married    Spouse name: Not on file   Number of children: 2   Years of education: Not on file   Highest education level: Not on file  Occupational History   Occupation: English as a second language teacher    Employer: [employer]  Tobacco Use  Smoking status: Never   Smokeless tobacco: Never  Vaping Use   Vaping Use: Never used  Substance and Sexual Activity   Alcohol use: No   Drug use: No   Sexual activity: Yes    Birth control/protection: Pill  Other Topics Concern   Not on file  Social History Narrative   Caffeine use: none   Regular exercise:  No   2 children son Marie Ortega 2004 and daughter Marie Ortega 2007   Works as a English as a second language teacher   Married   No pets   Social Determinants of Radio broadcast assistant Strain: Not on Art therapist Insecurity: Not on file  Transportation Needs: Not on file  Physical Activity: Not on file  Stress: Not on file  Social Connections: Not on file  Intimate Partner Violence: Not on file    Review of Systems:  All systems reviewed an negative except where noted in HPI.  Gen: Denies any fever, chills,  sweats, anorexia, fatigue, weakness, malaise, weight loss, and sleep disorder CV: Denies chest pain, angina, palpitations, syncope, orthopnea, PND, peripheral edema, and claudication. Resp: Denies dyspnea at rest, dyspnea with exercise, cough, sputum, wheezing, coughing up blood, and pleurisy. GI: Denies vomiting blood, jaundice, and fecal incontinence.   Denies dysphagia or odynophagia. GU : Denies urinary burning, blood in urine, urinary frequency, urinary hesitancy, nocturnal urination, and urinary incontinence. MS: Denies joint pain, limitation of movement, and swelling, stiffness, low back pain, extremity pain. Denies muscle weakness, cramps, atrophy.  Derm: Denies rash, itching, dry skin, hives, moles, warts, or unhealing ulcers.  Psych: Denies depression, anxiety, memory loss, suicidal ideation, hallucinations, paranoia, and confusion. Heme: Denies bruising, bleeding, and enlarged lymph nodes. Neuro:  Denies any headaches, dizziness, paresthesias. Endo:  Denies any problems with DM, thyroid, adrenal function.   Physical Exam: General:  Alert, well-developed, in NAD Head:  Normocephalic and atraumatic. Eyes:  Sclera clear, no icterus.   Conjunctiva pink. Ears:  Normal auditory acuity. Mouth:  No deformity or lesions.  Neck:  Supple; no masses . Lungs:  Clear throughout to auscultation.   No wheezes, crackles, or rhonchi. No acute distress. Heart:  Regular rate and rhythm; no murmurs. Abdomen:  Soft, nondistended, nontender. No masses, hepatomegaly. No obvious masses.  Normal bowel .    Rectal:  Deferred   Msk:  Symmetrical without gross deformities.. Pulses:  Normal pulses noted. Extremities:  Without edema. Neurologic:  Alert and  oriented x4;  grossly normal neurologically. Skin:  Intact without significant lesions or rashes. Cervical Nodes:  No significant cervical adenopathy. Psych:  Alert and cooperative. Normal mood and affect.   Impression / Ortega:   Personal history of  colon polyps, 1 tubular adenoma and one sessile serrated polyp, family history of colon cancer for colonoscopy.  Marie Ortega. Marie Ortega  12/29/2020, 3:23 PM See Marie Ortega, [facility], to contact our on call provider

## 2020-12-31 ENCOUNTER — Telehealth: Payer: Self-pay | Admitting: *Deleted

## 2020-12-31 ENCOUNTER — Telehealth: Payer: Self-pay

## 2020-12-31 NOTE — Telephone Encounter (Signed)
°  Follow up Call-  Call back number 12/29/2020  Post procedure Call Back phone  # [phone]  Permission to leave phone message Yes  Some recent data might be hidden     Patient questions:  Do you have a fever, pain , or abdominal swelling? No. Pain Score  0 *  Have you tolerated food without any problems? Yes.    Have you been able to return to your normal activities? Yes.    Do you have any questions about your discharge instructions: Diet   No. Medications  No. Follow up visit  No.  Do you have questions or concerns about your Care? No.  Actions: * If pain score is 4 or above: No action needed, pain <4.  Have you developed a fever since your procedure? no  2.   Have you had an respiratory symptoms (SOB or cough) since your procedure? no  3.   Have you tested positive for COVID 19 since your procedure no  4.   Have you had any family members/close contacts diagnosed with the COVID 19 since your procedure?  no   If yes to any of these questions please route to Joylene John, RN and Joella Prince, RN

## 2020-12-31 NOTE — Telephone Encounter (Signed)
No answer, left message to call back later today, B.Kalliope Riesen RN. 

## 2021-11-30 ENCOUNTER — Ambulatory Visit: Payer: BC Managed Care – PPO | Admitting: Family Medicine

## 2021-11-30 ENCOUNTER — Encounter: Payer: Self-pay | Admitting: Family Medicine

## 2021-11-30 VITALS — BP 120/64 | Ht 65.0 in | Wt 130.0 lb

## 2021-11-30 DIAGNOSIS — M25851 Other specified joint disorders, right hip: Secondary | ICD-10-CM | POA: Insufficient documentation

## 2021-11-30 NOTE — Progress Notes (Signed)
  Marie Ortega - 51 y.o. female MRN [id number]  Date of birth: 05-18-1970  SUBJECTIVE:  Including CC & ROS.  No chief complaint on file.   Marie Ortega is a 51 y.o. female that is presenting with acute right hip pain.  The pain is anterior in nature.  Does have some radiation to the knee.  Has a history of surgery for a labral tear few years ago.    Review of Systems See HPI   HISTORY: Past Medical, Surgical, Social, and Family History Reviewed & Updated per EMR.   Pertinent Historical Findings include:  Past Medical History:  Diagnosis Date   Adenomatous colon polyp 12/2008   gets colonoscopies every 3 years.     Allergy    Depression    no per pt   Dysplastic nevi    GERD (gastroesophageal reflux disease)    History of chicken pox    History of telogen effluvium    Vitamin D deficiency     Past Surgical History:  Procedure Laterality Date   COLONOSCOPY     HIP SURGERY Right 07/13/2012   Dr Thea Silversmith [facility]   POLYPECTOMY     UPPER GASTROINTESTINAL ENDOSCOPY       PHYSICAL EXAM:  VS: BP 120/64   Ht '5\' 5"'$  (1.651 m)   Wt 130 lb (59 kg)   BMI 21.63 kg/m  Physical Exam Gen: NAD, alert, cooperative with exam, well-appearing MSK:  Neurovascularly intact      ASSESSMENT & PLAN:   Hip impingement syndrome, right Acutely occurring.  Has a history of labral tear repair.  Does not feel exactly like her previous hip pain. -Counseled on home exercise therapy and supportive care. -Could consider injection or physical therapy.

## 2021-11-30 NOTE — Assessment & Plan Note (Signed)
Acutely occurring.  Has a history of labral tear repair.  Does not feel exactly like her previous hip pain. -Counseled on home exercise therapy and supportive care. -Could consider injection or physical therapy.

## 2021-11-30 NOTE — Patient Instructions (Signed)
Good to see you Please alternate heat and ice  Please try the exercises   Please send me a message in MyChart with any questions or updates.  Please see me back in 4-6 weeks.   --Dr. Raeford Razor

## 2022-05-03 ENCOUNTER — Encounter: Payer: Self-pay | Admitting: *Deleted

## 2022-11-26 ENCOUNTER — Ambulatory Visit: Payer: BC Managed Care – PPO | Admitting: Physician Assistant

## 2022-11-26 ENCOUNTER — Encounter: Payer: Self-pay | Admitting: Physician Assistant

## 2022-11-26 VITALS — BP 104/69 | HR 78 | Temp 98.1°F | Ht 65.0 in | Wt 131.1 lb

## 2022-11-26 DIAGNOSIS — R300 Dysuria: Secondary | ICD-10-CM

## 2022-11-26 DIAGNOSIS — R1030 Lower abdominal pain, unspecified: Secondary | ICD-10-CM

## 2022-11-26 LAB — POC URINALSYSI DIPSTICK (AUTOMATED)
Bilirubin, UA: NEGATIVE
Blood, UA: NEGATIVE
Glucose, UA: NEGATIVE
Ketones, UA: NEGATIVE
Leukocytes, UA: NEGATIVE
Nitrite, UA: NEGATIVE
Protein, UA: NEGATIVE
Spec Grav, UA: 1.015 (ref 1.010–1.025)
Urobilinogen, UA: 0.2 U/dL
pH, UA: 7 (ref 5.0–8.0)

## 2022-11-26 NOTE — Progress Notes (Unsigned)
      Established patient visit   Patient: Marie Ortega   DOB: 17-Jul-1970   52 y.o. Female  MRN: [id number] Visit Date: 11/26/2022  Today's healthcare provider: Alfredia Ferguson, PA-C   Chief Complaint  Patient presents with   Urinary Tract Infection    No burning when peeing but she is having some "tingling" going on.   Subjective     Pt reports tingling/prickling,  Lower bladder,    Medications: Outpatient Medications Prior to Visit  Medication Sig   cycloSPORINE (RESTASIS) 0.05 % ophthalmic emulsion Restasis 0.05 % eye drops in a dropperette   furosemide (LASIX) 20 MG tablet TAKE 1 TABLET EVERY DAY AS NEEDED   loratadine (CLARITIN) 10 MG tablet Take 10 mg by mouth daily.   Probiotic Product (ALIGN PO) Take by mouth daily.   progesterone (PROMETRIUM) 100 MG capsule Take 100 mg by mouth at bedtime.   [DISCONTINUED] pantoprazole (PROTONIX) 40 MG tablet Take 1 tablet (40 mg total) by mouth daily.   [DISCONTINUED] TAYTULLA 1-20 MG-MCG(24) CAPS Take 1 capsule by mouth daily. (Patient not taking: Reported on 12/29/2020)   No facility-administered medications prior to visit.    Review of Systems {Insert previous labs (optional):23779} {See past labs  Heme  Chem  Endocrine  Serology  Results Review (optional):1}   Objective    BP 104/69   Pulse 78   Temp 98.1 F (36.7 C) (Oral)   Ht 5\' 5"  (1.651 m)   Wt 131 lb 2 oz (59.5 kg)   SpO2 100%   BMI 21.82 kg/m  {Insert last BP/Wt (optional):23777}{See vitals history (optional):1}  Physical Exam Vitals reviewed.  Constitutional:      Appearance: She is not ill-appearing.  HENT:     Head: Normocephalic.  Eyes:     Conjunctiva/sclera: Conjunctivae normal.  Cardiovascular:     Rate and Rhythm: Normal rate.  Pulmonary:     Effort: Pulmonary effort is normal. No respiratory distress.  Abdominal:     Palpations: Abdomen is soft.     Tenderness: There is no abdominal tenderness. There is no right CVA tenderness,  left CVA tenderness or guarding.  Neurological:     General: No focal deficit present.     Mental Status: She is alert and oriented to person, place, and time.  Psychiatric:        Mood and Affect: Mood normal.        Behavior: Behavior normal.     ***  No results found for any visits on 11/26/22.  Assessment & Plan    Dysuria -     POCT Urinalysis Dipstick (Automated)    ***  No follow-ups on file.       Alfredia Ferguson, PA-C  [facility] [phone] (phone) [phone] (fax)  [facility]

## 2022-11-27 LAB — URINALYSIS, MICROSCOPIC ONLY
Bacteria, UA: NONE SEEN /[HPF]
Hyaline Cast: NONE SEEN /[LPF]
RBC / HPF: NONE SEEN /[HPF] (ref 0–2)

## 2022-11-27 LAB — URINE CULTURE
MICRO NUMBER:: 15706071
SPECIMEN QUALITY:: ADEQUATE

## 2022-11-29 ENCOUNTER — Encounter: Payer: Self-pay | Admitting: Physician Assistant

## 2023-01-31 NOTE — Progress Notes (Signed)
 [facility] Return Visit   Referring Provider Daryl Setter, NP [address] [place],  [place] [address]  Primary Care Provider Daryl Setter, NP  Patient Profile: Marie Ortega is a 53 y.o. female who returns to the [facility] for follow-up of the problem(s) noted below.  Problem List: Chronic constipation Personal history of colonic adenomatous and sessile serrated polyps Family history of colon cancer in first-degree relative < 60 years   History of Present Illness   Marie Ortega was last seen in the GI office 12/2020 for a procedure visit (colonoscopy)   Current GI Meds  Probiotic  Interval History  Marie Ortega presents today reporting ongoing struggles with constipation She has been managing her constipation with prunes, MiraLAX  Despite these measures she continues to have difficulty fully evacuating bowel movement -endorses episodes of straining When using MiraLAX stools have a sludgy type consistency States that she was seen by an integrative medicine practitioner who advised the use of magnesium but this reported and diarrhea She became concerned recently when she had an episode of what seems to be encopresis finding stool in her undergarment Notes that when she is constipated she will develop a pain in her back No diagnosis of pelvic floor dysfunction but does relate having a perineal tear with a vaginal delivery.  Marie Ortega is careful about her diet -consumes a plant-based diet and has been gluten-free for 6 years She consumes an adequate amount of hydration Notes she typically eats slowly to avoid symptoms of fullness  Otherwise denies GERD, dysphagia, odynophagia, nausea, vomiting  Weight, energy and appetite have been stable  There is a pertinent family history of colorectal cancer in her mother at age 35 years As below, last colonoscopy was performed in 2022 and was endoscopically normal -repeat  advised in 5 years due to family history of colorectal cancer in first-degree relative.  Noncon CT urogram performed 04/29/2022 was unremarkable from a bowel perspective  Last colonoscopy: 12/2020 -endoscopically normal; repeat 2027 Last endoscopy: 06/2017 -diffuse moderate erythema in the distal esophagus, no stenosis but empiric dilation performed to 16 mm, normal stomach and duodenum  Last Abd CT/CTE/MRE: Noncon CT urogram performed 4/11//2024 was unremarkable from a bowel perspective  GI Review of Symptoms Significant for constipation. Otherwise negative.  General Review of Systems  Review of systems is significant for the pertinent positives and negatives as listed per the HPI.  Full ROS is otherwise negative.  Past Medical History   Past Medical History:  Diagnosis Date   Adenomatous colon polyp 12/2008   gets colonoscopies every 3 years.     Allergy    Depression    no per pt   Dysplastic nevi    GERD (gastroesophageal reflux disease)    History of chicken pox    History of telogen effluvium    Vitamin D  deficiency       Past Surgical History   Past Surgical History:  Procedure Laterality Date   COLONOSCOPY     HIP SURGERY Right 07/13/2012   Dr [facility]   POLYPECTOMY     UPPER GASTROINTESTINAL ENDOSCOPY      Allergies and Medications  No Known Allergies   Current Meds  Medication Sig   cycloSPORINE (RESTASIS) 0.05 % ophthalmic emulsion Restasis 0.05 % eye drops in a dropperette   furosemide  (LASIX ) 20 MG tablet TAKE 1 TABLET EVERY DAY AS NEEDED   linaclotide  (LINZESS ) 145 MCG CAPS capsule Take 1 capsule (145 mcg  total) by mouth daily before breakfast.   loratadine (CLARITIN) 10 MG tablet Take 10 mg by mouth daily.   Prasterone, DHEA, (DHEA PO) Take 5 mg by mouth daily.   Probiotic Product (ALIGN PO) Take by mouth daily.   Probiotic Product (PROBIOTIC PO) Take 1 capsule by mouth daily.   progesterone (PROMETRIUM) 100 MG capsule  Take 100 mg by mouth at bedtime.     Family History   Family History  Problem Relation Age of Onset   Colon cancer Mother 59   Colon polyps Maternal Grandmother    Sudden death Neg Hx    Hypertension Neg Hx    Hyperlipidemia Neg Hx    Heart attack Neg Hx    Stomach cancer Neg Hx    Rectal cancer Neg Hx    Esophageal cancer Neg Hx   .  Social History   Social History   Social History Narrative   Caffeine use: none   Regular exercise:  No   2 children son rankin 2004 and daughter lauraine 2007   Works as a charity fundraiser   Married   No pets   Magdalina reports that she has never smoked. She has never used smokeless tobacco. She reports that she does not drink alcohol and does not use drugs.  Vital Signs and Physical Examination   Vitals:   02/01/23 1030  BP: 100/70  Pulse: 72  Height: 5' 5 (1.651 m)  Weight: 133 lb 3 oz (60.4 kg)  BMI (Calculated): 22.16    General: Well developed, well nourished, no acute distress Head: Normocephalic and atraumatic Eyes: Sclerae anicteric, EOMI Ears: Normal auditory acuity Mouth: No deformities or lesions noted Lungs: Clear throughout to auscultation Heart: Regular rate and rhythm; No murmurs, rubs or bruits Abdomen: Soft, non tender and non distended. No masses, hepatosplenomegaly or hernias noted. Normal Bowel sounds Rectal:Deferred Musculoskeletal: Symmetrical with no gross deformities  Pulses:  Normal pulses noted Extremities: No edema or deformities noted Neurological: Alert oriented x 4, grossly nonfocal Psychological:  Alert and cooperative. Normal mood and affect   Review of Data  The following data was reviewed at the time of this encounter:  Laboratory Studies   Lab Results  Component Value Date   WBC 6.0 07/25/2017   HGB 12.9 07/25/2017   HCT 38.4 07/25/2017   MCV 93.4 07/25/2017   PLT 249.0 07/25/2017     Chemistry      Component Value Date/Time   NA 139 08/17/2017 0953   K 5.1 08/17/2017 0953   CL 104  08/17/2017 0953   CO2 32 08/17/2017 0953   BUN 14 08/17/2017 0953   CREATININE 0.84 08/17/2017 0953   CREATININE 0.76 07/10/2013 1220      Component Value Date/Time   CALCIUM 9.6 08/17/2017 0953   ALKPHOS 53 05/11/2016 0952   AST 16 05/11/2016 0952   ALT 13 05/11/2016 0952   BILITOT 0.5 05/11/2016 0952      Lab Results  Component Value Date   TSH 1.39 11/10/2016    Imaging Studies  CTAP (non con) 04/29/2022 Bilateral nonobstructive renal stones measuring up to 9 cm to the left and 3 cm. No obstructive ureteral or bladder calculi identified Dominant follicle in the left ovary is considered benign requiring no independent imaging follow-up. Aortic atherosclerosis  GI Procedures and Studies  Colonoscopy 12/2020 Endoscopically normal  EGD/colonoscopy 07/12/2017 EGD - diffuse moderate erythema in the distal esophagus, no stenosis but empiric dilation performed to 16 mm, normal stomach and duodenum Colon -  Two sessile polyps in the transverse colon measuring 4 mm Path: 1 TA and 1 SSA   Clinical Impression  It is my clinical impression that Marie Ortega is a 53 y.o. female with;  Chronic constipation Personal history of colonic adenomatous and sessile serrated polyps Family history of colon cancer in first-degree relative < 60 years  Marie Ortega presents with symptoms of chronic constipation that sound to be slow transit in nature.  She has had difficulty regulating her bowel movements with use of over-the-counter medications in the cough MiraLAX as well as dietary measures such as prunes.  At today's visit we discussed options for prescription laxatives.  Given the possibility of slow transit constipation I have suggested a trial of a moderate dose of Linzess  145 mcg daily.  This can be titrated and adjusted depending upon her response.  She does endorse some difficulty evacuating bowel movements.  We reviewed that there could also be a component of pelvic floor dysfunction.  If we are  unable to regulate her bowel movements with diet and pharmacotherapy can consider the possibility of anorectal manometry to evaluate for pelvic floor dysfunction.  There is a pertinent family history of colorectal cancer in Marie Ortega's mother at age 10.  She has been vigilant regarding routine colonoscopies.  Previous colonoscopy showed evidence of tubular adenoma and sessile serrated adenoma in 2019.  Colonoscopy in 2022 was devoid of polyps and she is due for her next surveillance procedure in 2027.  Plan  Trial of Linzess  145 mcg orally daily -consider dose titration depending upon response.  Motegrity or Trulance may be other potential therapeutic measures. May continue the use of prunes and MiraLAX if needed If there is future evidence of obstructive defecation can consider possibility of anorectal manometry to evaluate for pelvic floor dysfunction' Continue current dietary modification with plant-based and gluten-free diet Monitor weight anthropometrics  Planned Follow Up 2-4 months  The patient or caregiver verbalized understanding of the material covered, with no barriers to understanding. All questions were answered. Patient or caregiver is agreeable with the plan outlined above.    It was a pleasure to see Marie Ortega.  If you have any questions or concerns regarding this evaluation, do not hesitate to contact me.  Inocente Hausen, MD [facility]   I spent total of 30 minutes in both face-to-face and non-face-to-face activities, excluding procedures performed, for the visit on the date of this encounter.

## 2023-02-01 ENCOUNTER — Ambulatory Visit: Payer: BC Managed Care – PPO | Admitting: Pediatrics

## 2023-02-01 ENCOUNTER — Encounter: Payer: Self-pay | Admitting: Pediatrics

## 2023-02-01 VITALS — BP 100/70 | HR 72 | Ht 65.0 in | Wt 133.2 lb

## 2023-02-01 DIAGNOSIS — Z800 Family history of malignant neoplasm of digestive organs: Secondary | ICD-10-CM

## 2023-02-01 DIAGNOSIS — Z860101 Personal history of adenomatous and serrated colon polyps: Secondary | ICD-10-CM | POA: Diagnosis not present

## 2023-02-01 DIAGNOSIS — Z860100 Personal history of colon polyps, unspecified: Secondary | ICD-10-CM

## 2023-02-01 DIAGNOSIS — K5900 Constipation, unspecified: Secondary | ICD-10-CM

## 2023-02-01 MED ORDER — LINACLOTIDE 145 MCG PO CAPS: 145.0000 ug | ORAL_CAPSULE | Freq: Every day | ORAL | 1 refills | Status: DC

## 2023-02-01 NOTE — Patient Instructions (Signed)
 We have sent the following medications to your pharmacy for you to pick up at your convenience: Linzess  145 mcg daily  Please follow up with Dr Suzann in 3-4 months.  Due to recent changes in healthcare laws, you may see the results of your imaging and laboratory studies on MyChart before your provider has had a chance to review them.  We understand that in some cases there may be results that are confusing or concerning to you. Not all laboratory results come back in the same time frame and the provider may be waiting for multiple results in order to interpret others.  Please give us  48 hours in order for your provider to thoroughly review all the results before contacting the office for clarification of your results.   _______________________________________________________  If your blood pressure at your visit was 140/90 or greater, please contact your primary care physician to follow up on this.  _______________________________________________________  If you are age 51 or older, your body mass index should be between 23-30. Your Body mass index is 22.16 kg/m. If this is out of the aforementioned range listed, please consider follow up with your Primary Care Provider.  If you are age 51 or younger, your body mass index should be between 19-25. Your Body mass index is 22.16 kg/m. If this is out of the aformentioned range listed, please consider follow up with your Primary Care Provider.   ________________________________________________________  The [facility] providers would like to encourage you to use MYCHART to communicate with providers for non-urgent requests or questions.  Due to long hold times on the telephone, sending your provider a message by [facility] may be a faster and more efficient way to get a response.  Please allow 48 business hours for a response.  Please remember that this is for non-urgent requests.  _______________________________________________________

## 2023-02-16 ENCOUNTER — Telehealth: Payer: Self-pay | Admitting: Pediatrics

## 2023-02-16 NOTE — Telephone Encounter (Signed)
Inbound call from patient requesting a call to discuss recent medication. States she has been having complications with Linzess. Please advise, thank you.

## 2023-02-16 NOTE — Telephone Encounter (Signed)
Patient calls stating that she began taking her Linzess 145 mcg prescription on Sunday. She complains of multiple episodes of diarrhea, gas and loud borborygmi daily since beginning the medication. She does note that she didn't have too much diarrhea yesterday, although it has returned today. I advised that we often see an increase in diarrhea, gas and bloating within the first 7-10 days of beginning this particular medication and that her body should begin adjusting to the medication. Advised that if she continues to feel that the medication is not being tolerated, there are additional options we can explore. Patient verbalizes understanding.  Any additional recommendations or thoughts?

## 2023-02-18 ENCOUNTER — Encounter: Payer: Self-pay | Admitting: Pediatrics

## 2023-03-11 MED ORDER — LINACLOTIDE 72 MCG PO CAPS: 72.0000 ug | ORAL_CAPSULE | Freq: Every day | ORAL | 3 refills | Status: AC

## 2023-03-11 NOTE — Addendum Note (Signed)
Addended by: Richardson Chiquito on: 03/11/2023 08:50 AM   Modules accepted: Orders
